# Patient Record
Sex: Female | Born: 1975 | Hispanic: No | Marital: Married | Smoking: Never smoker
Health system: Southern US, Community
[De-identification: ages and names within clinical notes are randomized; demographics above are authoritative.]

## PROBLEM LIST (undated history)

## (undated) DIAGNOSIS — N390 Urinary tract infection, site not specified: Secondary | ICD-10-CM

## (undated) DIAGNOSIS — K8020 Calculus of gallbladder without cholecystitis without obstruction: Secondary | ICD-10-CM

## (undated) HISTORY — PX: DILATION AND CURETTAGE OF UTERUS: SHX78

## (undated) HISTORY — PX: TUBAL LIGATION: SHX77

## (undated) HISTORY — PX: CHOLECYSTECTOMY: SHX55

---

## 2004-12-20 ENCOUNTER — Emergency Department (HOSPITAL_COMMUNITY): Admission: EM | Admit: 2004-12-20 | Discharge: 2004-12-20 | Payer: Self-pay | Admitting: Emergency Medicine

## 2004-12-29 ENCOUNTER — Observation Stay (HOSPITAL_COMMUNITY): Admission: RE | Admit: 2004-12-29 | Discharge: 2004-12-30 | Payer: Self-pay | Admitting: Gastroenterology

## 2008-12-02 ENCOUNTER — Emergency Department (HOSPITAL_COMMUNITY): Admission: EM | Admit: 2008-12-02 | Discharge: 2008-12-03 | Payer: Self-pay | Admitting: Emergency Medicine

## 2008-12-23 ENCOUNTER — Emergency Department (HOSPITAL_COMMUNITY): Admission: EM | Admit: 2008-12-23 | Discharge: 2008-12-23 | Payer: Self-pay | Admitting: Emergency Medicine

## 2010-11-01 LAB — URINALYSIS, ROUTINE W REFLEX MICROSCOPIC
Glucose, UA: NEGATIVE mg/dL
Protein, ur: NEGATIVE mg/dL
Specific Gravity, Urine: 1.004 — ABNORMAL LOW (ref 1.005–1.030)

## 2010-11-01 LAB — URINE CULTURE

## 2010-11-01 LAB — URINE MICROSCOPIC-ADD ON

## 2010-11-01 LAB — POCT I-STAT, CHEM 8
HCT: 39 % (ref 36.0–46.0)
Hemoglobin: 13.3 g/dL (ref 12.0–15.0)
Potassium: 3.6 mEq/L (ref 3.5–5.1)
Sodium: 139 mEq/L (ref 135–145)

## 2010-11-01 LAB — POCT PREGNANCY, URINE: Preg Test, Ur: NEGATIVE

## 2010-12-09 NOTE — H&P (Signed)
[name], [name]                  ACCOUNT NO.:  [id number]   MEDICAL RECORD NO.:  [id number]          PATIENT TYPE:  OBV   LOCATION:  5151                         FACILITY:  [facility]   PHYSICIAN:  Jordan Hawks. Elnoria Howard, MD    DATE OF BIRTH:  Aug 16, 1975   DATE OF ADMISSION:  12/29/2004  DATE OF DISCHARGE:                                HISTORY & PHYSICAL   REASON FOR ADMISSION:  Abdominal pain post ERCP.   HISTORY OF PRESENT ILLNESS:  This is a 35 year old female with a past  medical history of cholelithiasis, status post ERCP for abnormal liver  enzymes and intra as well as extrahepatic biliary ductal dilation.  The ERCP  was performed without complication.  Pancreatic duct was not cannulated  during the procedure.  The ERCP was not definitive for ductal stones.  However, CPD was dilated at 1.5 centimeters in the greatest dimension.  A 1  centimeter sphincterotomy was made without complications.  Postprocedurely,  the patient complained about having epigastric pain and nausea and vomiting.  She also states that there is some radiation of the pain to the back.  Vital  signs remained stable in the recovery area.   PAST MEDICAL HISTORY:  As stated above.   PAST SURGICAL HISTORY:  As stated above.   FAMILY HISTORY:  Noncontributory.   SOCIAL HISTORY:  The patient is married with three children.   MEDICATIONS:  None.   ALLERGIES:  No known drug allergies.   PHYSICAL EXAMINATION:  VITAL SIGNS:  Blood pressure 105/58, heart rate 76,  pulse oximetry 98, respirations 20, temperature 97.5.  GENERAL:  The patient is uncomfortable appearing.  HEENT:  Normocephalic and atraumatic.  Extraocular muscles are intact.  Pupils are equal, round and reactive to light.  NECK:  Supple.  No lymphadenopathy.  LUNGS:  Clear to auscultation bilaterally.  CARDIOVASCULAR:  Regular rate and rhythm.  ABDOMEN:  Flat, soft, tympanic, positive bowel sounds.  There is epigastric  tenderness.  No rebound.  EXTREMITIES:  No clubbing, cyanosis, or edema.   LABORATORY DATA:  Pending at this time.   IMPRESSION:  Abdominal pain questionable secondary to post ERCP  pancreatitis, perforation or gaseous distention.  It will be prudent to  admit the patient for further observation.   PLAN:  1.  Admit for 23 hours observation or longer if necessary.  2.  NPO.  3.  To check a KUB at this time to rule out perforation.  4.  To check CBC, CMET, amylase and lipase.       PDH/MEDQ  D:  12/30/2004  T:  12/30/2004  Job:  [id number]

## 2010-12-09 NOTE — Discharge Summary (Signed)
[name], [name]                  ACCOUNT NO.:  [id number]   MEDICAL RECORD NO.:  [id number]          PATIENT TYPE:  OBV   LOCATION:  5151                         FACILITY:  [facility]   PHYSICIAN:  Jordan Hawks. Elnoria Howard, MD    DATE OF BIRTH:  [birth year]   DATE OF ADMISSION:  12/29/2004  DATE OF DISCHARGE:  12/30/2004                                 DISCHARGE SUMMARY   ADMISSION DIAGNOSIS:  Abdominal pain status post ERCP.   DISCHARGE DIAGNOSIS:  1.  Mild post ERCP pancreatitis.  2.  Dilated bile ducts.  3.  Status post cholecystectomy.   HISTORY OF PRESENT ILLNESS:  Briefly, the patient is a 35 year old female  with a past medical history of cholelithiasis who was noted to have dilated  ducts and abnormal liver enzymes on acute evaluation in the emergency room  a couple of weeks prior to admission.  The patient stated that she had  epigastric pain which resulted in workup.  The pain had resolved and she was  subsequently referred to Dr. Elnoria Howard as an outpatient for further evaluation.  Upon review of the patient's ultrasound and laboratory values, it was felt  that the patient may have a retained stone in the common bile duct.  Therefore, she was scheduled for an ERCP.  This was performed on December 29, 2004, and the procedure was performed without any complications.  However,  post procedure, the patient did complain about having abdominal pain in the  epigastric region with some radiation to the back.  Subsequently, the  patient was admitted for further evaluation and treatment.   PAST MEDICAL HISTORY:  As stated in the history of present illness.   PAST SURGICAL HISTORY:  As stated in the history of present illness.   FAMILY HISTORY:  Noncontributory.   SOCIAL HISTORY:  The patient is married with three children.   ALLERGIES:  No known drug allergies.   LABORATORY DATA:  On admission, AST 103, ALT 102, total bilirubin 1.3,  alkaline phos 104, amylase 394, lipase 39, albumin 3.8.   Sodium 136,  potassium 3.6, chloride 105, CO2 23, BUN 5, creatinine 0.7, glucose 99.  White blood cell count 10.9, hemoglobin 12.5, platelets 289.   HOSPITAL COURSE:  The patient was admitted and a KUB was performed which was  negative for any evidence of perforation.  She was also started on pain  medications with morphine 2 to 4 mg IV.  Over the course of the evening, she  did require it, however, at 3 a.m. on December 30, 2004, she did not request any  further pain medication.  On morning rounds, the patient was evaluated and  her pain had markedly improved at that time.  It was felt that she would be  able to be advanced on a liquid diet which she tolerated without difficulty.  Upon re-examination immediately prior to discharge, the patient states that  the pain has almost resolved and she was able to tolerate p.o. without  problems.  Review of the laboratory values revealed there is a marked  decline in her amylase  from 394 to 203, transaminases have also decreased.  It was felt that the patient can be discharged safely without any further  problems.  Overall, it was felt that the patient had a mild post ERCP  pancreatitis.   DISCHARGE INSTRUCTIONS:  The patient is scheduled to follow up with Dr. Elnoria Howard  in two weeks.  Percocet, 15 tablets, 5/325 mg p.o., will be provided for the  patient for precautionary basis if the pain should increase.  She is also  instructed to advance her diet slowly when she is at home.  If any further  problems arise, she is to return to the emergency room  or to call Dr. Elnoria Howard  for any difficulties.       PDH/MEDQ  D:  12/30/2004  T:  12/30/2004  Job:  [id number]

## 2011-07-22 ENCOUNTER — Emergency Department (HOSPITAL_COMMUNITY)
Admission: EM | Admit: 2011-07-22 | Discharge: 2011-07-22 | Disposition: A | Discharge status: Home / Self Care | Payer: Self-pay | Attending: Emergency Medicine | Admitting: Emergency Medicine

## 2011-07-22 ENCOUNTER — Encounter: Payer: Self-pay | Admitting: Emergency Medicine

## 2011-07-22 DIAGNOSIS — N390 Urinary tract infection, site not specified: Secondary | ICD-10-CM | POA: Insufficient documentation

## 2011-07-22 DIAGNOSIS — R3915 Urgency of urination: Secondary | ICD-10-CM | POA: Insufficient documentation

## 2011-07-22 DIAGNOSIS — R300 Dysuria: Secondary | ICD-10-CM | POA: Insufficient documentation

## 2011-07-22 DIAGNOSIS — R350 Frequency of micturition: Secondary | ICD-10-CM | POA: Insufficient documentation

## 2011-07-22 HISTORY — DX: Calculus of gallbladder without cholecystitis without obstruction: K80.20

## 2011-07-22 HISTORY — DX: Urinary tract infection, site not specified: N39.0

## 2011-07-22 LAB — URINE MICROSCOPIC-ADD ON

## 2011-07-22 LAB — URINALYSIS, ROUTINE W REFLEX MICROSCOPIC
Nitrite: POSITIVE — AB
Specific Gravity, Urine: 1.005 (ref 1.005–1.030)
pH: 7 (ref 5.0–8.0)

## 2011-07-22 MED ORDER — CIPROFLOXACIN HCL 500 MG PO TABS
500.0000 mg | ORAL_TABLET | Freq: Once | ORAL | Status: AC
  Administered 2011-07-22: 500 mg via ORAL
  Filled 2011-07-22: qty 1

## 2011-07-22 MED ORDER — CIPROFLOXACIN HCL 500 MG PO TABS: 500.0000 mg | ORAL_TABLET | Freq: Two times a day (BID) | ORAL | Status: AC

## 2011-07-22 NOTE — ED Provider Notes (Signed)
History     CSN: [id number]  Arrival date & time 07/22/11  0115   First MD Initiated Contact with Patient 07/22/11 0431      Chief Complaint  Patient presents with  . Dysuria    (Consider location/radiation/quality/duration/timing/severity/associated sxs/prior treatment) Patient is a 35 y.o. female presenting with dysuria. The history is provided by the patient and a relative. No language interpreter was used.  Dysuria  This is a recurrent problem. The current episode started 3 to 5 hours ago. The problem occurs every urination. The problem has not changed since onset.The quality of the pain is described as burning. The pain is at a severity of 8/10. The pain is severe. There has been no fever. She is sexually active. There is no history of pyelonephritis. Associated symptoms include frequency and urgency. Pertinent negatives include no chills, no sweats, no vomiting, no discharge, no possible pregnancy and no flank pain. She has tried nothing for the symptoms. Her past medical history is significant for recurrent UTIs. Her past medical history does not include kidney stones, single kidney or urological procedure.    Past Medical History  Diagnosis Date  . UTI (lower urinary tract infection)   . Gallstones     Past Surgical History  Procedure Date  . Cholecystectomy     No family history on file.  History  Substance Use Topics  . Smoking status: Never Smoker   . Smokeless tobacco: Not on file  . Alcohol Use: No    OB History    Grav Para Term Preterm Abortions TAB SAB Ect Mult Living                  Review of Systems  Constitutional: Negative for chills.  HENT: Negative for facial swelling.   Eyes: Negative for discharge.  Respiratory: Negative for apnea.   Cardiovascular: Negative for chest pain.  Gastrointestinal: Negative for vomiting and abdominal distention.  Genitourinary: Positive for dysuria, urgency and frequency. Negative for flank pain.    Musculoskeletal: Negative for arthralgias.  Skin: Negative.   Neurological: Negative.   Hematological: Negative.   Psychiatric/Behavioral: Negative.     Allergies  Review of patient's allergies indicates no known allergies.  Home Medications   Current Outpatient Rx  Name Route Sig Dispense Refill  . PHENAZOPYRIDINE HCL 100 MG PO TABS Oral Take 200 mg by mouth once. Urinary pain     . CIPROFLOXACIN HCL 500 MG PO TABS Oral Take 1 tablet (500 mg total) by mouth 2 (two) times daily. 14 tablet 0    BP 98/70  Pulse 72  Temp(Src) 97.9 F (36.6 C) (Oral)  Resp 14  SpO2 100%  LMP 06/22/2011  Physical Exam  Constitutional: She is oriented to person, place, and time. She appears well-developed and well-nourished.  HENT:  Head: Normocephalic and atraumatic.  Eyes: Conjunctivae are normal. Pupils are equal, round, and reactive to light.  Neck: Normal range of motion. Neck supple.  Cardiovascular: Normal rate and regular rhythm.   Pulmonary/Chest: Effort normal and breath sounds normal.  Abdominal: Soft. Bowel sounds are normal.  Musculoskeletal: Normal range of motion.  Neurological: She is alert and oriented to person, place, and time.  Psychiatric: She has a normal mood and affect.    ED Course  Procedures (including critical care time)  Labs Reviewed  URINALYSIS, ROUTINE W REFLEX MICROSCOPIC - Abnormal; Notable for the following:    Color, Urine ORANGE (*) BIOCHEMICALS MAY BE AFFECTED BY COLOR   APPearance CLOUDY (*)  Hgb urine dipstick LARGE (*)    Nitrite POSITIVE (*)    Leukocytes, UA LARGE (*)    All other components within normal limits  URINE MICROSCOPIC-ADD ON - Abnormal; Notable for the following:    Bacteria, UA FEW (*)    All other components within normal limits  POCT PREGNANCY, URINE  POCT PREGNANCY, URINE  POCT PREGNANCY, URINE   No results found.   1. Urinary tract infection       MDM  Return for worsening symptoms.  Follow up for  recheck        Serria Sloma K Nyeemah Jennette-Rasch, MD 07/22/11 [phone]

## 2011-07-22 NOTE — ED Notes (Signed)
Taught ways to prevent uti's

## 2011-07-22 NOTE — ED Notes (Signed)
PT. REPORTS DYSURIA ONSET THIS EVENING , DENIES HEMATURIA ,  HISTORY OF UTI ,  DENIES NAUSEA OR VOMITTING ,  NO FEVER OR CHILLS.

## 2011-07-24 LAB — POCT PREGNANCY, URINE: Preg Test, Ur: NEGATIVE

## 2015-02-05 ENCOUNTER — Encounter (HOSPITAL_COMMUNITY): Payer: Self-pay | Admitting: Emergency Medicine

## 2015-02-05 ENCOUNTER — Emergency Department (HOSPITAL_COMMUNITY)
Admission: EM | Admit: 2015-02-05 | Discharge: 2015-02-06 | Disposition: A | Discharge status: Home / Self Care | Payer: Self-pay | Attending: Emergency Medicine | Admitting: Emergency Medicine

## 2015-02-05 DIAGNOSIS — Z8719 Personal history of other diseases of the digestive system: Secondary | ICD-10-CM | POA: Insufficient documentation

## 2015-02-05 DIAGNOSIS — R112 Nausea with vomiting, unspecified: Secondary | ICD-10-CM | POA: Insufficient documentation

## 2015-02-05 DIAGNOSIS — R197 Diarrhea, unspecified: Secondary | ICD-10-CM | POA: Insufficient documentation

## 2015-02-05 DIAGNOSIS — R1084 Generalized abdominal pain: Secondary | ICD-10-CM | POA: Insufficient documentation

## 2015-02-05 DIAGNOSIS — Z87440 Personal history of urinary (tract) infections: Secondary | ICD-10-CM | POA: Insufficient documentation

## 2015-02-05 DIAGNOSIS — R109 Unspecified abdominal pain: Secondary | ICD-10-CM

## 2015-02-05 DIAGNOSIS — Z3202 Encounter for pregnancy test, result negative: Secondary | ICD-10-CM | POA: Insufficient documentation

## 2015-02-05 LAB — URINALYSIS, ROUTINE W REFLEX MICROSCOPIC
Bilirubin Urine: NEGATIVE
Glucose, UA: NEGATIVE mg/dL
HGB URINE DIPSTICK: NEGATIVE
Ketones, ur: >80 mg/dL — AB
Leukocytes, UA: NEGATIVE
Nitrite: NEGATIVE
PH: 7.5 (ref 5.0–8.0)
Protein, ur: NEGATIVE mg/dL
SPECIFIC GRAVITY, URINE: 1.019 (ref 1.005–1.030)
Urobilinogen, UA: 0.2 mg/dL (ref 0.0–1.0)

## 2015-02-05 LAB — I-STAT BETA HCG BLOOD, ED (MC, WL, AP ONLY)

## 2015-02-05 LAB — COMPREHENSIVE METABOLIC PANEL
ALT: 20 U/L (ref 14–54)
AST: 21 U/L (ref 15–41)
Albumin: 4 g/dL (ref 3.5–5.0)
Alkaline Phosphatase: 72 U/L (ref 38–126)
Anion gap: 12 (ref 5–15)
BUN: 12 mg/dL (ref 6–20)
CALCIUM: 8.9 mg/dL (ref 8.9–10.3)
CHLORIDE: 102 mmol/L (ref 101–111)
CO2: 23 mmol/L (ref 22–32)
CREATININE: 0.56 mg/dL (ref 0.44–1.00)
GFR calc non Af Amer: >60 mL/min (ref >60)
Glucose, Bld: 112 mg/dL — ABNORMAL HIGH (ref 65–99)
Potassium: 3.4 mmol/L — ABNORMAL LOW (ref 3.5–5.1)
Sodium: 137 mmol/L (ref 135–145)
Total Bilirubin: 1 mg/dL (ref 0.3–1.2)
Total Protein: 7.4 g/dL (ref 6.5–8.1)

## 2015-02-05 LAB — CBC
HCT: 36.2 % (ref 36.0–46.0)
HEMOGLOBIN: 12 g/dL (ref 12.0–15.0)
MCH: 28.6 pg (ref 26.0–34.0)
MCHC: 33.1 g/dL (ref 30.0–36.0)
MCV: 86.4 fL (ref 78.0–100.0)
PLATELETS: 276 10*3/uL (ref 150–400)
RBC: 4.19 MIL/uL (ref 3.87–5.11)
RDW: 12.3 % (ref 11.5–15.5)
WBC: 12.7 10*3/uL — ABNORMAL HIGH (ref 4.0–10.5)

## 2015-02-05 LAB — LIPASE, BLOOD: LIPASE: 24 U/L (ref 22–51)

## 2015-02-05 MED ORDER — MORPHINE SULFATE 4 MG/ML IJ SOLN
4.0000 mg | Freq: Once | INTRAMUSCULAR | Status: AC
  Administered 2015-02-05: 4 mg via INTRAVENOUS
  Filled 2015-02-05: qty 1

## 2015-02-05 MED ORDER — SODIUM CHLORIDE 0.9 % IV BOLUS (SEPSIS)
1000.0000 mL | Freq: Once | INTRAVENOUS | Status: AC
Start: 2015-02-05 — End: 2015-02-06
  Administered 2015-02-05: 1000 mL via INTRAVENOUS

## 2015-02-05 MED ORDER — SODIUM CHLORIDE 0.9 % IV BOLUS (SEPSIS)
1000.0000 mL | Freq: Once | INTRAVENOUS | Status: AC
Start: 2015-02-05 — End: 2015-02-06
  Administered 2015-02-06: 1000 mL via INTRAVENOUS

## 2015-02-05 MED ORDER — ONDANSETRON 4 MG PO TBDP
8.0000 mg | ORAL_TABLET | Freq: Once | ORAL | Status: AC
  Administered 2015-02-05: 8 mg via ORAL
  Filled 2015-02-05: qty 2

## 2015-02-05 MED ORDER — IOHEXOL 300 MG/ML  SOLN
25.0000 mL | Freq: Once | INTRAMUSCULAR | Status: AC | PRN
  Administered 2015-02-05: 25 mL via ORAL

## 2015-02-05 NOTE — ED Notes (Signed)
C/o mid upper abd pain x 3 days with nausea, vomiting, and diarrhea.

## 2015-02-05 NOTE — ED Provider Notes (Signed)
CSN: [id number]     Arrival date & time 02/05/15  2153 History   First MD Initiated Contact with Patient 02/05/15 2221     Chief Complaint  Patient presents with  . Abdominal Pain     (Consider location/radiation/quality/duration/timing/severity/associated sxs/prior Treatment) Patient is a 39 y.o. female presenting with abdominal pain. The history is provided by the patient and medical records. No language interpreter was used.  Abdominal Pain Associated symptoms: diarrhea, nausea and vomiting   Associated symptoms: no chest pain, no constipation, no cough, no dysuria, no fatigue, no fever, no hematuria and no shortness of breath      Ave P Bochicchio is a 39 y.o. female  with a hx of recurrent UTI, cholecystectomy 2/2 gallstones presents to the Emergency Department complaining of gradual, persistent, progressively worsening generalized abd pain onset 2 days ago and acutely worsening today.  Pt reports 10/10 sharp stabbing pain in the periumbilical region. Associated symptoms include nausea, vomiting and several episodes of loose stool.  She denies hematemesis, melanoma or hematochezia.  Patient without abdominal surgery aside from her cholecystectomy. No history of bowel obstruction.  Patient last ate at 6 PM but had emesis afterwards.  He denies sick contacts. Nothing makes it better or worse. Patient denies fever, chills, headache, neck pain, chest pain, shortness of breath, weakness, dizziness, syncope, dysuria, hematuria. Patient reports symptoms do not feel like her typical UTI. Last menstrual cycle 01/26/2015.   Past Medical History  Diagnosis Date  . UTI (lower urinary tract infection)   . Gallstones    Past Surgical History  Procedure Laterality Date  . Cholecystectomy     No family history on file. History  Substance Use Topics  . Smoking status: Never Smoker   . Smokeless tobacco: Not on file  . Alcohol Use: No   OB History    No data available     Review of Systems    Constitutional: Negative for fever, diaphoresis, appetite change, fatigue and unexpected weight change.  HENT: Negative for mouth sores.   Eyes: Negative for visual disturbance.  Respiratory: Negative for cough, chest tightness, shortness of breath and wheezing.   Cardiovascular: Negative for chest pain.  Gastrointestinal: Positive for nausea, vomiting, abdominal pain and diarrhea. Negative for constipation.  Endocrine: Negative for polydipsia, polyphagia and polyuria.  Genitourinary: Negative for dysuria, urgency, frequency and hematuria.  Musculoskeletal: Negative for back pain and neck stiffness.  Skin: Negative for rash.  Allergic/Immunologic: Negative for immunocompromised state.  Neurological: Negative for syncope, light-headedness and headaches.  Hematological: Does not bruise/bleed easily.  Psychiatric/Behavioral: Negative for sleep disturbance. The patient is not nervous/anxious.       Allergies  Review of patient's allergies indicates no known allergies.  Home Medications   Prior to Admission medications   Not on File   BP 94/57 mmHg  Pulse 73  Temp(Src) 97.6 F (36.4 C) (Oral)  Resp 18  SpO2 100%  LMP 01/26/2015 Physical Exam  Constitutional: She appears well-developed and well-nourished. No distress.  Awake, alert, nontoxic appearance  HENT:  Head: Normocephalic and atraumatic.  Mouth/Throat: Oropharynx is clear and moist. No oropharyngeal exudate.  Eyes: Conjunctivae are normal. No scleral icterus.  Neck: Normal range of motion. Neck supple.  Cardiovascular: Normal rate, regular rhythm and intact distal pulses.   Pulmonary/Chest: Effort normal and breath sounds normal. No respiratory distress. She has no wheezes.  Equal chest expansion  Abdominal: Soft. Bowel sounds are normal. She exhibits no distension and no mass. There is tenderness. There  is guarding. There is no rebound and no CVA tenderness.    Well-healed midline incision Generalized tenderness  to palpation with guarding throughout No CVA tenderness  Musculoskeletal: Normal range of motion. She exhibits no edema.  Neurological: She is alert.  Speech is clear and goal oriented Moves extremities without ataxia  Skin: Skin is warm and dry. She is not diaphoretic.  Psychiatric: She has a normal mood and affect.  Nursing note and vitals reviewed.   ED Course  Procedures (including critical care time) Labs Review Labs Reviewed  COMPREHENSIVE METABOLIC PANEL - Abnormal; Notable for the following:    Potassium 3.4 (*)    Glucose, Bld 112 (*)    All other components within normal limits  CBC - Abnormal; Notable for the following:    WBC 12.7 (*)    All other components within normal limits  URINALYSIS, ROUTINE W REFLEX MICROSCOPIC (NOT [facility]) - Abnormal; Notable for the following:    APPearance CLOUDY (*)    Ketones, ur >80 (*)    All other components within normal limits  LIPASE, BLOOD  I-STAT BETA HCG BLOOD, ED ([facility], [facility], [facility] ONLY)    Imaging Review No results found.   EKG Interpretation None      MDM   Final diagnoses:  Abdominal pain, acute  Abdominal pain, acute   Shonika P Ruppert presents with severe abdominal pain, guarding on exam.  Pt reports N/V/D.   Labs with leukocytosis and mild hypokalemia, but no other abnormalities.  Pt abd without rebound, but guarding on exam.  Will CT, give pain control and fluids.  Pt with >80 keytones in her urine.  Fluids given.  1:30 AM CT pending.  Pt discussed with Dr. Mora Bellmanni who will follow.  Anticipate d/c home if CT negative and pt is able to tolerate PO.    BP 94/57 mmHg  Pulse 73  Temp(Src) 97.6 F (36.4 C) (Oral)  Resp 18  SpO2 100%  LMP 01/26/2015   Dahlia ClientHannah Aliece Honold, PA-C 02/06/15 0130  Mirian MoMatthew Gentry, MD 02/08/15 2348

## 2015-02-05 NOTE — ED Notes (Signed)
The pt is c.o generalized abd pain today with nv and diarrhea  lmp July 5th.  She ate once 1800  Some soap and vomited it back

## 2015-02-06 ENCOUNTER — Emergency Department (HOSPITAL_COMMUNITY): Payer: Self-pay

## 2015-02-06 ENCOUNTER — Encounter (HOSPITAL_COMMUNITY): Payer: Self-pay

## 2015-02-06 MED ORDER — RANITIDINE HCL 150 MG PO CAPS: 300.0000 mg | ORAL_CAPSULE | Freq: Every day | ORAL | Status: DC

## 2015-02-06 MED ORDER — RANITIDINE HCL 150 MG/10ML PO SYRP
300.0000 mg | ORAL_SOLUTION | Freq: Once | ORAL | Status: AC
  Administered 2015-02-06: 300 mg via ORAL
  Filled 2015-02-06: qty 20

## 2015-02-06 MED ORDER — IOHEXOL 300 MG/ML  SOLN
100.0000 mL | Freq: Once | INTRAMUSCULAR | Status: AC | PRN
  Administered 2015-02-06: 80 mL via INTRAVENOUS

## 2015-02-06 NOTE — ED Notes (Signed)
Pt has iv started given pain med

## 2015-02-06 NOTE — ED Notes (Signed)
To c-t.  Iv fluids infused

## 2015-02-06 NOTE — ED Provider Notes (Addendum)
Patient was signed out to me as pending CT scan for evaluation of abdominal pain. CT scan reveals possible abdominal ulcer which was nonperforated. Will give the patient ranitidine prior to discharge and write a prescription.GI follow-up was advised within 3 days. Currently she is asymptomatic and has no pain.   Tomasita CrumbleAdeleke Tyri Elmore, MD 02/06/15 [phone]

## 2015-02-06 NOTE — ED Notes (Signed)
The pt has drank both bottles of the oral contrast c-t will get at 0100

## 2015-02-06 NOTE — ED Notes (Signed)
Pt returned from   c-t   No pain 

## 2015-02-06 NOTE — Discharge Instructions (Signed)
Peptic Ulcer [name], takes Zantac daily until you see gastroenterology for follow-up. Make an appointment within the next 3 days. If any symptoms worsen come back to the emergency department immediately. Thank you. A peptic ulcer is a painful sore in the lining of your esophagus, stomach, or in the first part of your small intestine. The main causes of an ulcer can be:  An infection.  Using certain pain medicines too often or too much.  Smoking. HOME CARE  Avoid smoking, alcohol, and caffeine.  Avoid foods that bother you.  Only take medicine as told by your doctor. Do not take any medicines your doctor has not approved.  Keep all doctor visits as told. GET HELP IF:  You do not get better in 7 days after starting treatment.  You keep having an upset stomach (indigestion) or heartburn. GET HELP RIGHT AWAY IF:  You have sudden, sharp, or lasting belly (abdominal) pain.  You have bloody, black, or tarry poop (stool).  You throw up (vomit) blood or your throw up looks like coffee grounds.  You get light-headed, weak, or feel like you will pass out (faint).  You get sweaty or feel sticky and cold to the touch (clammy). MAKE SURE YOU:   Understand these instructions.  Will watch your condition.  Will get help right away if you are not doing well or get worse. Document Released: 10/04/2009 Document Revised: 11/24/2013 Document Reviewed: 02/07/2012 [facility] Patient Information 2015 [place]. This information is not intended to replace advice given to you by your health care provider. Make sure you discuss any questions you have with your health care provider.

## 2016-09-04 ENCOUNTER — Encounter: Payer: Self-pay | Admitting: Obstetrics & Gynecology

## 2016-09-04 ENCOUNTER — Ambulatory Visit (INDEPENDENT_AMBULATORY_CARE_PROVIDER_SITE_OTHER): Payer: Self-pay | Admitting: Obstetrics & Gynecology

## 2016-09-04 VITALS — BP 95/58 | HR 74 | Wt 115.8 lb

## 2016-09-04 DIAGNOSIS — N939 Abnormal uterine and vaginal bleeding, unspecified: Secondary | ICD-10-CM

## 2016-09-04 DIAGNOSIS — R300 Dysuria: Secondary | ICD-10-CM

## 2016-09-04 DIAGNOSIS — Z113 Encounter for screening for infections with a predominantly sexual mode of transmission: Secondary | ICD-10-CM

## 2016-09-04 DIAGNOSIS — R301 Vesical tenesmus: Secondary | ICD-10-CM

## 2016-09-04 LAB — POCT URINALYSIS DIP (DEVICE)
Bilirubin Urine: NEGATIVE
Glucose, UA: NEGATIVE mg/dL
KETONES UR: NEGATIVE mg/dL
Leukocytes, UA: NEGATIVE
Nitrite: NEGATIVE
PH: 6 (ref 5.0–8.0)
PROTEIN: NEGATIVE mg/dL
Specific Gravity, Urine: 1.01 (ref 1.005–1.030)
Urobilinogen, UA: 0.2 mg/dL (ref 0.0–1.0)

## 2016-09-04 MED ORDER — AMITRIPTYLINE HCL 10 MG PO TABS: 50.0000 mg | ORAL_TABLET | Freq: Every day | ORAL | Status: DC

## 2016-09-04 NOTE — Progress Notes (Signed)
Pt declines interpreter, signed release for her daughter to interpret for her.

## 2016-09-04 NOTE — Progress Notes (Signed)
History:  41 y.o. [id number] here today for eval of pelvic pain. Pt c/o dysuria. She report sx for 1 year.  She has been treated for UTI with no relief of sx.  Pt reports pain 3 days per week.  Pt reports relief with Tylenol or NSAIDs. No exacerbating factors.  In Nov and Dec her period lasted 12 days. In Jan her menses lasted 7 days and then 1 week later the same sx but no menses.   Pt reports pain with intercourse. When having intercourse the pain is with insertion.  Pt reports discharge that was excessive.  She is not sure if it had an odor. She thinks that it smells like 'raw chicken.' Pt reports that the discharge feels 'oily' The pain is worse with menses.  Since Nov the pain is worse with menses in a great degree.  She is s/p Sterilization.   The following portions of the patient's history were reviewed and updated as appropriate: allergies, current medications, past family history, past medical history, past social history, past surgical history and problem list.  Review of Systems:  Pertinent items are noted in HPI.   Objective:  Physical Exam Blood pressure (!) 95/58, pulse 74, weight 115 lb 12.8 oz (52.5 kg). BP (!) 95/58   Pulse 74   Wt 115 lb 12.8 oz (52.5 kg)  CONSTITUTIONAL: Well-developed, well-nourished female in no acute distress.  HENT:  Normocephalic, atraumatic EYES: Conjunctivae and EOM are normal. No scleral icterus.  NECK: Normal range of motion SKIN: Skin is warm and dry. No rash noted. Not diaphoretic.No pallor. NEUROLGIC: Alert and oriented to person, place, and time. Normal coordination.  Abd: Soft, nontender and nondistended Pelvic: Normal appearing external genitalia; normal appearing vaginal mucosa and cervix.   Discharge- thin grey.  Small uterus, no other palpable masses. There is tenderness to palpation over the bladder. No apparent uterine or adnexal tenderness  Labs and Imaging 07/06/2016 IMPRESSION:  Bilateral ovarian cysts   Result Narrative   COMPARISON:None. TECHNIQUE:Transvaginal and transabdominal INDICATION: N93.8: Other specified abnormal uterine and vaginal bleeding.   FINDINGS:  UTERUS: - Uterus is 8.6 cm. - No focal uterine abnormality is seen. - Endometrium measures 10 mm.  CUL-DE-SAC: - Trace fluid pelvic cul-de-sac.  OVARIES: - Right ovary measures 3.9 cm. Simple right ovarian cyst present measuring 2.5 cm and 2 cm. - Left ovary measures 3.1 cm. - No abnormal solid ovarian masses. There is a 2.0 cm left ovarian cyst. -  ADDITIONAL/INCIDENTAL:     Assessment & Plan:  Painful bladder.  Possible bladder spasms. Cannot r/o interstitial cystitis   Amitriptyline  25mg  po nightly for 1 week then increase to 50mg  nightly. Pt to decrease or eliminate all caffeine  Pt to f/u in 4 weeks or sooner prn   Total face-to-face time with patient was 30 min.  Greater than 50% was spent in counseling and coordination of care with the patient. Spanish interpreter used.   Daman Steffenhagen L. Harraway-Smith, M.D., [facility]

## 2016-09-04 NOTE — Patient Instructions (Signed)
Cistitis intersticial (Interstitial Cystitis) La cistitis intersticial es una afeccin que causa inflamacin de la vejiga. La vejiga es un rgano hueco que se encuentra en la parte inferior del abdomen. Almacena la orina que producen los riones. La cistitis intersticial puede causar dolor en la zona de la vejiga. Tambin es posible que tenga la necesidad frecuente y urgente de orinar. La gravedad de la cistitis intersticial puede variar de una persona a la otra. Puede presentar exacerbaciones de la afeccin, que luego quizs desaparezcan durante un tiempo. Para muchas personas, esta afeccin se convierte en un problema a largo plazo. CAUSAS Se desconoce la causa de esta afeccin. FACTORES DE RIESGO Es ms probable que esta afeccin se manifieste en las mujeres. SNTOMAS Los sntomas de la cistitis intersticial varan y pueden modificarse con el tiempo. Entre los sntomas se pueden incluir los siguientes:  Molestias o dolor en la zona de la vejiga. Pueden variar de leves a graves. La intensidad del dolor puede variar en funcin de si la vejiga est llena con orina o vaca.  Dolor en la pelvis.  Necesidad urgente de orinar.  Ganas frecuentes de orinar.  Dolor durante las relaciones sexuales.  Sangrado en forma de puntitos en la pared de la vejiga. En las mujeres, los sntomas a menudo empeoran durante la menstruacin. DIAGNSTICO Esta afeccin se diagnostica evaluando los sntomas y descartando otras causas. Se le practicar un examen fsico. Pueden hacerle diversos estudios para descartar otras afecciones. Entre los ms frecuentes, se incluyen los siguientes:  Anlisis de orina.  Una cistoscopa. En este estudio, se utiliza un instrumento similar a un telescopio muy pequeo para observar la vejiga.  Biopsia. La biopsia consiste en extraer una muestra de tejido de la pared de la vejiga para examinar con un microscopio. TRATAMIENTO La cistitis intersticial no tiene cura, pero hay  tratamientos para controlar los sntomas. Trabaje en estrecha colaboracin con el mdico a fin de hallar los tratamientos que sean ms eficaces para su caso. Las opciones de tratamiento son las siguientes:  Medicamentos que alivien el dolor y ayuden a reducir la cantidad de veces que sienta la necesidad de orinar.  Entrenamiento de la vejiga. Esto consiste en aprender maneras de controlar las micciones, por ejemplo:  Orinar en horarios programados.  Entrenarse para retrasar la miccin.  Hacer ejercicios (ejercicios de Kegel) para fortalecer los msculos que controlan el flujo de orina.  Cambios en el estilo de vida, por ejemplo, modificar la dieta o tomar medidas para controlar el estrs.  Uso de un dispositivo que emite estimulacin elctrica para reducir el dolor.  Un procedimiento que extiende la vejiga al llenarla de aire o lquido.  Ciruga. Esto es raro. Se realiza solamente en casos extremos si los otros tratamientos no son eficaces. INSTRUCCIONES PARA EL CUIDADO EN EL HOGAR  Tome los medicamentos solamente como se lo haya indicado el mdico.  Practique las tcnicas de entrenamiento de la vejiga como se lo hayan indicado.  Lleve un diario de micciones para determinar qu alimentos, lquidos o actividades empeoran los sntomas.  Use el diario de micciones para programar las veces que va al bao. Si saldr de su casa, organcese de modo de poder cumplir con la programacin de micciones.  Asegrese de orinar antes de salir de su casa y antes de acostarse.  Haga los ejercicios de Kegel como se lo haya indicado el mdico.  No beba alcohol.  No consuma ningn producto que contenga tabaco, lo que incluye cigarrillos, tabaco de mascar o cigarrillos electrnicos.   Si necesita ayuda para dejar de fumar, consulte al mdico.  Haga cambios en la dieta como se lo haya indicado el mdico. Quizs deba evitar los alimentos picantes y los alimentos con un alto contenido de [place].  Limite  la ingesta de bebidas que estimulen la miccin. Estas bebidas incluyen refrescos, caf y t.  Concurra a todas las visitas de control como se lo haya indicado el mdico. Esto es importante. SOLICITE ATENCIN MDICA SI:  Los sntomas no mejoran despus de Medical illustratorrealizar el tratamiento.  El dolor y las molestias [place].  Tiene necesidad urgente de orinar con mayor frecuencia.  Tiene fiebre. SOLICITE ATENCIN MDICA DE INMEDIATO SI:  No puede controlar la vejiga para nada. Esta informacin no tiene Theme park managercomo fin reemplazar el consejo del mdico. Asegrese de hacerle al mdico cualquier pregunta que tenga. Document Released: 10/26/2008 Document Revised: 07/31/2014 Document Reviewed: 03/17/2014 Elsevier Interactive Patient Education  2017 [employer]

## 2016-09-05 ENCOUNTER — Other Ambulatory Visit: Payer: Self-pay | Admitting: Obstetrics & Gynecology

## 2016-09-05 DIAGNOSIS — N760 Acute vaginitis: Principal | ICD-10-CM

## 2016-09-05 DIAGNOSIS — B9689 Other specified bacterial agents as the cause of diseases classified elsewhere: Secondary | ICD-10-CM

## 2016-09-05 LAB — CERVICOVAGINAL ANCILLARY ONLY
Bacterial vaginitis: POSITIVE — AB
Candida vaginitis: NEGATIVE
Trichomonas: NEGATIVE

## 2016-09-05 MED ORDER — METRONIDAZOLE 500 MG PO TABS: 500.0000 mg | ORAL_TABLET | Freq: Two times a day (BID) | ORAL | 0 refills | Status: DC

## 2016-09-05 MED ORDER — AMITRIPTYLINE HCL 50 MG PO TABS: 50.0000 mg | ORAL_TABLET | Freq: Every day | ORAL | 1 refills | Status: AC

## 2016-09-14 NOTE — Progress Notes (Signed)
I called [place] home and work number with [employer] interpreter [phone] and was unable to leave a message on either line - for both heard a message not accepting calls at this time.

## 2016-09-18 ENCOUNTER — Encounter: Payer: Self-pay | Admitting: *Deleted

## 2016-09-18 NOTE — Progress Notes (Signed)
Letter to patient with results information.

## 2016-10-06 ENCOUNTER — Ambulatory Visit (INDEPENDENT_AMBULATORY_CARE_PROVIDER_SITE_OTHER): Payer: Self-pay | Admitting: Obstetrics & Gynecology

## 2016-10-06 ENCOUNTER — Encounter: Payer: Self-pay | Admitting: Obstetrics & Gynecology

## 2016-10-06 VITALS — BP 106/67 | HR 78 | Wt 112.9 lb

## 2016-10-06 DIAGNOSIS — N939 Abnormal uterine and vaginal bleeding, unspecified: Secondary | ICD-10-CM

## 2016-10-06 DIAGNOSIS — R3989 Other symptoms and signs involving the genitourinary system: Secondary | ICD-10-CM

## 2016-10-06 DIAGNOSIS — R301 Vesical tenesmus: Secondary | ICD-10-CM

## 2016-10-06 MED ORDER — NORETHIN ACE-ETH ESTRAD-FE 1-20 MG-MCG(24) PO TABS: 1.0000 | ORAL_TABLET | Freq: Every day | ORAL | 11 refills | Status: DC

## 2016-10-06 NOTE — Patient Instructions (Addendum)
Dismenorrea (Dysmenorrhea) Los Stage manager (dismenorrea) se originan en los espasmos musculares del tero (contracciones) durante el perodo menstrual. En algunas mujeres, el malestar es slo [place]. En otras, la dismenorrea puede ser tan intensa que interfiere con las actividades diarias durante algunos das, [employer]. La dismenorrea primaria son los clicos menstruales que duran algunos das al inicio de los perodos o poco despus. En general se inician despus de que la adolescente comienza a tener sus perodos. A medida que la mujer avanza en edad, o tiene un beb, los clicos generalmente disminuyen o desaparecen. La dismenorrea secundaria comienza en pocas posteriores de la vida, dura ms tiempo, y Conservation officer, historic buildings puede ser ms intenso. El dolor puede comenzar antes del perodo y [place] despus. CAUSAS La causa de la dismenorrea generalmente es un problema subyacente, por ejemplo:  El tejido que recubre interiormente al tero crece fuera del tero en otras reas del cuerpo (endometriosis).  The endometrial tissue, which normally lines the uterus, is found in or grows into the muscular walls of the uterus (adenomyosis).  Los vasos sanguneos de la pelvis se congestionan con sangre antes del perodo menstrual (sndrome congestivo plvico).  Crecimiento excesivo de las clulas (plipos) del revestimiento interior del tero o del cuello uterino.  Cada del tero (prolapso) debido a distensin o flojedad de los ligamentos.  Depresin.  Problemas como infeccin o inflamacin en la vejiga.  Problemas en el intestino, un tumor, o sndrome de colon irritable.  Cncer de los rganos femeninos o en la vejiga.  Un tero muy inclinado.  Una apertura muy [place] uterino cerrado.  Tumores no cancerosos en el tero (fibromas).  Enfermedad plvica inflamatoria (EPI)  Cicatrices en la pelvis (adherencias) por cirugas previas.  Quiste  ovrico  El uso del dispositivo intrauterino (DIU) como mtodo anticonceptivo. FACTORES DE RIESGO Puede tener ms riesgo de dismenorrea si:  Tiene menos de 30 aos.  La pubertad se inici temprano.  Tiene sangrado irregular o abundante.  No ha tenido hijos.  Tiene una historia familiar de este problema.  Es fumadora. SIGNOS Y SNTOMAS  Clicos y dolor punzante en la zona inferior del abdomen.  Dolores de [place].  Dolor de Doctor, hospital.  Nuseas o vmitos.  Diarrea.  Sudoracin o [employer].  Heces blandas. DIAGNSTICO El diagnstico se basa en la historia personal, los sntomas, el examen fsico, las pruebas diagnsticas o procedimientos. Las pruebas diagnsticas o procedimientos pueden ser:  Anlisis de [place].  Ecografas.  Examen de las capas que cubren el tero (dilatacin y curetaje, D&C).  Examen de la zona interna del abdomen o la pelvis con un laparoscopio.  Radiografas.  Tomografa computada.  Resonancia magntica.  Un examen del interior de la vejiga con un citoscopio.  Examen del interior del intestino o el estmago con un dispositivo especial (colonoscopio o un gastroscopio). TRATAMIENTO El tratamiento depende de la causa de la dismenorrea. El tratamiento puede incluir:  Analgsicos indicados por su mdico.  Pldoras anticonceptivas o un DIU con progesterona.  Terapia de reemplazo hormonal.  Antiinflamatorios no esteroides (AINE). Los antiinflamatorios pueden detener la produccin de prostaglandinas.  Ciruga para extirpar las adherencias, endometriosis, quiste de ovarios o fibromas.  Extirpacin del tero (histerectoma).  Inyecciones de progesterona para detener el perodo menstrual.  Corte de los nervios del sacro que van hacia los rganos femeninos (neurectoma presacra).  Corriente elctrica en los nervios sacros (estimulacin de los nervios sacros).  Medicamentos antidepresivos.  Terapia psiquitrica, psicoterapia individual o de  grupo.  Actividad fsica y fisioterapia.  Meditacin y yoga.  Acupuntura. INSTRUCCIONES PARA EL CUIDADO EN EL HOGAR  Tome slo medicamentos de venta libre o recetados, segn las indicaciones del mdico.  Coloque una almohadilla elctrica o una botella con agua caliente en la zona inferior del abdomen. No duerma con la almohadilla trmica.  Haga ejercicios aerbicos como caminar, nadar, andar en bicicleta, para Target Corporationaliviar los clicos.  Masajear la zona inferior de la espalda o el abdomen puede ser de [place]uda.  Deje de fumar.  Evite la cafena y el alcohol. SOLICITE ATENCIN MDICA SI:  El dolor no mejora con los medicamentos recetados.  Tiene dolor durante las The St. Paul Travelersrelaciones sexuales.  El dolor aumenta y no puede controlarlo con los medicamentos.  Observa una hemorragia vaginal anormal con el perodo.  Tiene nuseas o vmitos con el perodo menstrual que no puede controlar con medicamentos. SOLICITE ATENCIN MDICA DE INMEDIATO SI: Se desmaya. Esta informacin no tiene Theme park managercomo fin reemplazar el consejo del mdico. Asegrese de hacerle al mdico cualquier pregunta que tenga. Document Released: 04/19/2005 Document Revised: 03/12/2013 Document Reviewed: 12/26/2012 Elsevier Interactive Patient Education  2017 Elsevier Inc.  Lubricants  - Water or silicone-based  - No perfumes; avoid glycerin or parabens  - If water based look for information on osmolality  - Lubricate all surfaces as a part of foreplay  - Keep lubricant handy in case more is needed  - Sneaking into bathroom before sex is not a good way to use lubricants

## 2016-10-06 NOTE — Progress Notes (Signed)
History:  41 y.o. [id number] here today for reeval of irreg menses and dysuria.  Pt reports that her painful bladder is much improved.  She does report some somnolence with the meds but thinks that it is getting better.  She does report that staying away from caffeine has also helped. She did drink caffeine 2 days and her sx came back some.   She reports menses 2x/month assoc with cramping. Both her mother and her sister went through early menopause and she wondered about that. She denies hot flushes or mood changes.  She reports decreased libido but, thinks that it may be assoc with the pain.  The following portions of the patient's history were reviewed and updated as appropriate: allergies, current medications, past family history, past medical history, past social history, past surgical history and problem list.  Review of Systems:  Pertinent items are noted in HPI.   Objective:  Physical Exam Blood pressure 106/67, pulse 78, weight 112 lb 14.4 oz (51.2 kg). BP 106/67   Pulse 78   Wt 112 lb 14.4 oz (51.2 kg)  CONSTITUTIONAL: Well-developed, well-nourished female in no acute distress.  HENT:  Normocephalic, atraumatic EYES: Conjunctivae and EOM are normal. No scleral icterus.  NECK: Normal range of motion SKIN: Skin is warm and dry. No rash noted. Not diaphoretic.No pallor. NEUROLGIC: Alert and oriented to person, place, and time. Normal coordination.   Labs and Imaging No results found.  Assessment & Plan:  Painful bladder syndrome- sx improved  cont Amytryptyline 50mg  qHS  Stay away from caffeine  AUB/Dysmenorrhea-   LoEstrin 1/20 1 po q day  Pelvic US  Decreased libido   Rec control pain initially   Rec date night  Rec  [place] [employer]  Total face-to-face time with patient was 25 min.  Greater than 50% was spent in counseling and coordination of care with the patient.     Klyde Banka L. Harraway-Smith, M.D., Evern CoreFACOG

## 2016-10-13 ENCOUNTER — Ambulatory Visit (HOSPITAL_COMMUNITY)
Admission: RE | Admit: 2016-10-13 | Discharge: 2016-10-13 | Disposition: A | Discharge status: Home / Self Care | Payer: Self-pay | Source: Ambulatory Visit | Attending: Obstetrics & Gynecology | Admitting: Obstetrics & Gynecology

## 2016-10-13 DIAGNOSIS — R938 Abnormal findings on diagnostic imaging of other specified body structures: Secondary | ICD-10-CM | POA: Insufficient documentation

## 2016-10-13 DIAGNOSIS — R301 Vesical tenesmus: Secondary | ICD-10-CM

## 2016-10-13 DIAGNOSIS — N939 Abnormal uterine and vaginal bleeding, unspecified: Secondary | ICD-10-CM | POA: Insufficient documentation

## 2016-10-16 ENCOUNTER — Telehealth: Payer: Self-pay | Admitting: General Practice

## 2016-10-16 NOTE — Telephone Encounter (Signed)
Per Dr Erin FullingHarraway Smith, patient's ultrasound was normal and she should continue her OCPs. Called patient with [employer] for interpreter, her husband answered stating she wasn't in at the moment but will tell her we called.

## 2016-10-18 NOTE — Telephone Encounter (Signed)
Called pt with [employer] interpreter # [phone] and informed her of normal US results. She should continue taking the OCP's as prescribed.  Pt voiced understanding.

## 2016-10-30 ENCOUNTER — Telehealth: Payer: Self-pay

## 2016-10-31 ENCOUNTER — Inpatient Hospital Stay (HOSPITAL_COMMUNITY)
Admission: AD | Admit: 2016-10-31 | Discharge: 2016-10-31 | Disposition: A | Discharge status: Home / Self Care | Payer: Self-pay | Source: Ambulatory Visit | Attending: Obstetrics & Gynecology | Admitting: Obstetrics & Gynecology

## 2016-10-31 ENCOUNTER — Encounter (HOSPITAL_COMMUNITY): Payer: Self-pay | Admitting: *Deleted

## 2016-10-31 DIAGNOSIS — N939 Abnormal uterine and vaginal bleeding, unspecified: Secondary | ICD-10-CM | POA: Insufficient documentation

## 2016-10-31 DIAGNOSIS — Z9049 Acquired absence of other specified parts of digestive tract: Secondary | ICD-10-CM | POA: Insufficient documentation

## 2016-10-31 DIAGNOSIS — R102 Pelvic and perineal pain: Secondary | ICD-10-CM | POA: Insufficient documentation

## 2016-10-31 DIAGNOSIS — Z9889 Other specified postprocedural states: Secondary | ICD-10-CM | POA: Insufficient documentation

## 2016-10-31 DIAGNOSIS — Z87440 Personal history of urinary (tract) infections: Secondary | ICD-10-CM | POA: Insufficient documentation

## 2016-10-31 DIAGNOSIS — N898 Other specified noninflammatory disorders of vagina: Secondary | ICD-10-CM | POA: Insufficient documentation

## 2016-10-31 DIAGNOSIS — Z79899 Other long term (current) drug therapy: Secondary | ICD-10-CM | POA: Insufficient documentation

## 2016-10-31 LAB — URINALYSIS, ROUTINE W REFLEX MICROSCOPIC
Bilirubin Urine: NEGATIVE
Glucose, UA: NEGATIVE mg/dL
KETONES UR: NEGATIVE mg/dL
Leukocytes, UA: NEGATIVE
Nitrite: NEGATIVE
PROTEIN: NEGATIVE mg/dL
Specific Gravity, Urine: 1.011 (ref 1.005–1.030)
pH: 7 (ref 5.0–8.0)

## 2016-10-31 LAB — CBC
HCT: 32.1 % — ABNORMAL LOW (ref 36.0–46.0)
HEMOGLOBIN: 10.9 g/dL — AB (ref 12.0–15.0)
MCH: 29.6 pg (ref 26.0–34.0)
MCHC: 34 g/dL (ref 30.0–36.0)
MCV: 87.2 fL (ref 78.0–100.0)
PLATELETS: 380 10*3/uL (ref 150–400)
RBC: 3.68 MIL/uL — ABNORMAL LOW (ref 3.87–5.11)
RDW: 13.1 % (ref 11.5–15.5)
WBC: 9.4 10*3/uL (ref 4.0–10.5)

## 2016-10-31 LAB — POCT PREGNANCY, URINE: PREG TEST UR: NEGATIVE

## 2016-10-31 MED ORDER — IBUPROFEN 800 MG PO TABS: 800.0000 mg | ORAL_TABLET | Freq: Three times a day (TID) | ORAL | 0 refills | Status: DC | PRN

## 2016-10-31 MED ORDER — KETOROLAC TROMETHAMINE 60 MG/2ML IM SOLN
60.0000 mg | Freq: Once | INTRAMUSCULAR | Status: AC
  Administered 2016-10-31: 60 mg via INTRAMUSCULAR
  Filled 2016-10-31: qty 2

## 2016-10-31 NOTE — MAU Note (Signed)
Pt states she started having bleeding and pain on 10/20/2016. States she was placed on OCP's but "isn't working." States she changes her pad x5/day. Took Midol 2 days ago, but none since because it did not help.

## 2016-10-31 NOTE — MAU Provider Note (Signed)
History     CSN: [id number]  Arrival date and time: 10/31/16 4098   First Provider Initiated Contact with Patient 10/31/16 0211      Chief Complaint  Patient presents with  . Vaginal Bleeding   Vaginal Bleeding  The patient's primary symptoms include pelvic pain and vaginal bleeding. This is a new problem. Episode onset: March 23. The problem occurs intermittently. Progression since onset: The bleeding is the same since starting birth control, and has not improved. She reports that the pain has not improved much either.  Pain severity now: 10/10  She is not pregnant. Associated symptoms include nausea. Pertinent negatives include no chills, fever or vomiting. The vaginal discharge was bloody. The vaginal bleeding is heavier than menses. She has been passing clots. She has not been passing tissue. Nothing aggravates the symptoms. Treatments tried: OCPs and amytripaline. Midol.  The treatment provided no relief. She uses oral contraceptives for contraception. Menstrual history: 09/23/16.   Past Medical History:  Diagnosis Date  . Gallstones   . UTI (lower urinary tract infection)     Past Surgical History:  Procedure Laterality Date  . CHOLECYSTECTOMY    . DILATION AND CURETTAGE OF UTERUS      History reviewed. No pertinent family history.  Social History  Substance Use Topics  . Smoking status: Never Smoker  . Smokeless tobacco: Never Used  . Alcohol use No    Allergies: No Known Allergies  Prescriptions Prior to Admission  Medication Sig Dispense Refill Last Dose  . Norethindrone Acetate-Ethinyl Estrad-FE (LOESTRIN 24 FE) 1-20 MG-MCG(24) tablet Take 1 tablet by mouth daily. 1 Package 11 10/31/2016 at Unknown time  . amitriptyline (ELAVIL) 50 MG tablet Take 1 tablet (50 mg total) by mouth at bedtime. 30 tablet 1 More than a month at Unknown time  . metroNIDAZOLE (FLAGYL) 500 MG tablet Take 1 tablet (500 mg total) by mouth 2 (two) times daily. (Patient not taking: Reported on  10/06/2016) 14 tablet 0 Unknown at Unknown time    Review of Systems  Constitutional: Negative for chills and fever.  Gastrointestinal: Positive for nausea. Negative for vomiting.  Genitourinary: Positive for pelvic pain and vaginal bleeding.   Physical Exam   Blood pressure 118/67, pulse 77, temperature 98 F (36.7 C), resp. rate 16, last menstrual period 10/20/2016, SpO2 100 %.  Physical Exam  Nursing note and vitals reviewed. Constitutional: She is oriented to person, place, and time. She appears well-developed and well-nourished. No distress.  HENT:  Head: Normocephalic.  Cardiovascular: Normal rate.   Respiratory: Effort normal.  GI: Soft. There is tenderness (midline ). There is no rebound.  Neurological: She is alert and oriented to person, place, and time.  Skin: Skin is warm and dry.  Psychiatric: She has a normal mood and affect.     Results for orders placed or performed during the hospital encounter of 10/31/16 (from the past 24 hour(s))  Urinalysis, Routine w reflex microscopic     Status: Abnormal   Collection Time: 10/31/16  1:04 AM  Result Value Ref Range   Color, Urine YELLOW YELLOW   APPearance CLOUDY (A) CLEAR   Specific Gravity, Urine 1.011 1.005 - 1.030   pH 7.0 5.0 - 8.0   Glucose, UA NEGATIVE NEGATIVE mg/dL   Hgb urine dipstick LARGE (A) NEGATIVE   Bilirubin Urine NEGATIVE NEGATIVE   Ketones, ur NEGATIVE NEGATIVE mg/dL   Protein, ur NEGATIVE NEGATIVE mg/dL   Nitrite NEGATIVE NEGATIVE   Leukocytes, UA NEGATIVE NEGATIVE  RBC / HPF TOO NUMEROUS TO COUNT 0 - 5 RBC/hpf   WBC, UA 6-30 0 - 5 WBC/hpf   Bacteria, UA RARE (A) NONE SEEN   Squamous Epithelial / LPF 0-5 (A) NONE SEEN   Mucous PRESENT   Pregnancy, urine POC     Status: None   Collection Time: 10/31/16  1:47 AM  Result Value Ref Range   Preg Test, Ur NEGATIVE NEGATIVE  CBC     Status: Abnormal   Collection Time: 10/31/16  2:28 AM  Result Value Ref Range   WBC 9.4 4.0 - 10.5 K/uL   RBC  3.68 (L) 3.87 - 5.11 MIL/uL   Hemoglobin 10.9 (L) 12.0 - 15.0 g/dL   HCT 40.9 (L) 81.1 - 91.4 %   MCV 87.2 78.0 - 100.0 fL   MCH 29.6 26.0 - 34.0 pg   MCHC 34.0 30.0 - 36.0 g/dL   RDW 78.2 95.6 - 21.3 %   Platelets 380 150 - 400 K/uL     MAU Course  Procedures  MDM Patient has had toradol, and she reports that her pain is better.   Assessment and Plan   1. Pelvic pain   2. Abnormal uterine bleeding    DC home Comfort measures reviewed  Bleeding precautions RX: ibuprofen  PRN #[id number]  Return to MAU as needed FU with OB as planned  Follow-up Information    Willodean Rosenthal, MD Follow up.   Specialty:  Obstetrics and Gynecology Contact information: [address] [place] [address] [phone]            Tawnya Crook 10/31/2016, 2:19 AM

## 2016-10-31 NOTE — Discharge Instructions (Signed)
Sangrado uterino anormal °(Abnormal Uterine Bleeding) °Sangrado uterino anormal significa que hay un sangrado por la vagina que no es su período menstrual normal. Puede ser: °· Pérdidas de sangre o hemorragias entre los períodos. °· Hemorragias luego de tener sexo (relaciones sexuales). °· Sangrado abundante o más que lo habitual. °· Períodos que duran más que lo normal. °· Sangrado luego de la menopausia. °Hay muchos problemas que pueden ser la causa. El tratamiento dependerá de la causa del sangrado. Cualquier tipo de sangrado que no sea normal debe consultarse con el médico. °CUIDADOS EN EL HOGAR °Controle su afección para ver si hay cambios. Estas indicaciones podrán disminuir cualquier molestia que tenga: °· No use tampones ni duchas vaginales o como le haya indicado el médico. °· Cambie los apósitos con frecuencia. °Deberá hacerse exámenes pélvicos regulares y pruebas de [place]apan[place]ola[place]. Realice los estudios indicados según le indique su médico. °SOLICITE AYUDA SI: °· El sangrado dura más de 1 semana. °· Se siente mareada por momentos. ° °SOLICITE AYUDA DE INMEDIATO SI: °· Se desmaya. °· Tiene que cambiarse los apósitos cada 15 a 30 minutos. °· Siente dolor en el abdomen. °· Tiene fiebre. °· Se siente débil o presenta sudoración. °· Elimina coágulos grandes por la vagina. °· Siente malestar estomacal (náuseas) y devuelve (vomita). ° °ASEGÚRESE DE QUE: °· Comprende estas instrucciones. °· Controlará su afección. °· Recibirá ayuda de inmediato si no mejora o si empeora. ° °Esta información no tiene como fin reemplazar el consejo del médico. Asegúrese de hacerle al médico cualquier pregunta que tenga. °Document Released: 08/12/2010 Document Revised: 07/15/2013 Document Reviewed: 02/06/2013 °Elsevier Interactive Patient Education © 2017 Elsevier Inc. ° °

## 2016-11-01 ENCOUNTER — Telehealth: Payer: Self-pay | Admitting: General Practice

## 2016-11-01 NOTE — Telephone Encounter (Signed)
Per Alanda Amass, patient requests call back concerning AUB. Called patient with [employer] interpreter [phone], no answer- left message that we are trying to reach you to return your phone call, please call back. Per chart review, patient was seen in MAU for concern yesterday

## 2016-11-02 NOTE — Telephone Encounter (Signed)
Enter in error

## 2016-11-14 ENCOUNTER — Ambulatory Visit (INDEPENDENT_AMBULATORY_CARE_PROVIDER_SITE_OTHER): Payer: Self-pay | Admitting: Obstetrics & Gynecology

## 2016-11-14 ENCOUNTER — Encounter: Payer: Self-pay | Admitting: Obstetrics & Gynecology

## 2016-11-14 ENCOUNTER — Other Ambulatory Visit (HOSPITAL_COMMUNITY)
Admission: RE | Admit: 2016-11-14 | Discharge: 2016-11-14 | Disposition: A | Discharge status: Home / Self Care | Payer: Self-pay | Source: Ambulatory Visit | Attending: Obstetrics & Gynecology | Admitting: Obstetrics & Gynecology

## 2016-11-14 DIAGNOSIS — R301 Vesical tenesmus: Secondary | ICD-10-CM

## 2016-11-14 DIAGNOSIS — N939 Abnormal uterine and vaginal bleeding, unspecified: Secondary | ICD-10-CM | POA: Insufficient documentation

## 2016-11-14 DIAGNOSIS — Z3202 Encounter for pregnancy test, result negative: Secondary | ICD-10-CM

## 2016-11-14 LAB — POCT PREGNANCY, URINE: Preg Test, Ur: NEGATIVE

## 2016-11-14 MED ORDER — NORETHIN ACE-ETH ESTRAD-FE 1-20 MG-MCG(24) PO TABS: 1.0000 | ORAL_TABLET | Freq: Every day | ORAL | 11 refills | Status: DC

## 2016-11-14 NOTE — Patient Instructions (Signed)
Biopsia de endometrio - Cuidados posteriores  (Endometrial Biopsy, Care After)  Siga estas instrucciones durante las próximas semanas. Estas indicaciones le proporcionan información general acerca de cómo deberá cuidarse después del procedimiento. El médico también podrá darle instrucciones más específicas. El tratamiento se ha planificado de acuerdo a las prácticas médicas actuales, pero a veces se producen problemas. Comuníquese con el médico si tiene algún problema o tiene dudas después del procedimiento.  QUÉ ESPERAR DESPUÉS DEL PROCEDIMIENTO  Después del procedimiento, es típico tener las siguientes sensaciones:  · Sentirá cólicos leves y tendrá una pequeña cantidad de sangrado vaginal durante algunos días después del procedimiento. Esto es normal.  INSTRUCCIONES PARA EL CUIDADO EN EL HOGAR  · Tome sólo medicamentos de venta libre o recetados, según las indicaciones del médico.  · No utilice tampones, duchas vaginales ni tenga relaciones sexuales hasta que el profesional la autorice.  · Siga las indicaciones del médico relacionadas con la restricción a ciertas actividades, como ejercicios físicos intensos o levantar objetos pesados.    SOLICITE ATENCIÓN MÉDICA SI:  · Tiene un sangrado abundante o sangra durante más de 2 días después del procedimiento.  · Advierte un olor fétido que proviene de la vagina.  · Siente escalofríos o tiene fiebre.  · Siente un dolor en el bajo vientre (abdominal) muy intenso.    SOLICITE ATENCIÓN MÉDICA DE INMEDIATO SI:  · Siente cólicos intensos en el estómago o en la espalda.  · Elimina coágulos grandes.  · La hemorragia aumenta.  · Se siente mareada, débil, o se desmaya.    Esta información no tiene como fin reemplazar el consejo del médico. Asegúrese de hacerle al médico cualquier pregunta que tenga.  Document Released: 04/30/2013 Document Revised: 04/30/2013 Document Reviewed: 12/25/2012  Elsevier Interactive Patient Education © 2017 Elsevier Inc.

## 2016-11-14 NOTE — Progress Notes (Signed)
History:  41 y.o. [id number] here today for eval of heavy bleeding and pain that was severe. Mar 28th-Apr 15. This was during the time of her reg cycle but, this pain was worse and the bleeding was heavier.  It was assoc with large clots. This was the longest cycle the pt has ever had.  Pt   Had a neg UPT in the ED.  Pt had a BTL.   Pt was on meds for her bladder but, they caused reflux. She has stopped the meds and her sx have not returned.     The following portions of the patient's history were reviewed and updated as appropriate: allergies, current medications, past family history, past medical history, past social history, past surgical history and problem list.  Review of Systems:  Pertinent items are noted in HPI.   Objective:  Physical Exam Blood pressure 107/69, pulse 72, height  (1.575 m), weight 122 lb 14.4 oz (55.7 kg), last menstrual period 10/20/2016. BP 107/69   Pulse 72   Ht  (1.575 m)   Wt 122 lb 14.4 oz (55.7 kg)   LMP 10/20/2016 (Exact Date)   BMI 22.48 kg/m  CONSTITUTIONAL: Well-developed, well-nourished female in no acute distress.  HENT:  Normocephalic, atraumatic EYES: Conjunctivae and EOM are normal. No scleral icterus.  NECK: Normal range of motion SKIN: Skin is warm and dry. No rash noted. Not diaphoretic.No pallor. NEUROLGIC: Alert and oriented to person, place, and time. Normal coordination.   Abd: Soft, nontender and nondistended Pelvic: Normal appearing external genitalia; normal appearing vaginal mucosa and cervix.  Normal discharge.  Small uterus, no other palpable masses, no uterine or adnexal tenderness  The indications for endometrial biopsy were reviewed.   Risks of the biopsy including cramping, bleeding, infection, uterine perforation, inadequate specimen and need for additional procedures  were discussed. The patient states she understands and agrees to undergo procedure today. Consent was signed. Time out was performed. Urine HCG was  negative. A sterile speculum was placed in the patient's vagina and the cervix was prepped with Betadine. A single-toothed tenaculum was placed on the anterior lip of the cervix to stabilize it. The 3 mm pipelle was introduced into the endometrial cavity without difficulty to a depth of 8cm, and a moderate amount of tissue was obtained and sent to pathology. The instruments were removed from the patient's vagina. Minimal bleeding from the cervix was noted. The patient tolerated the procedure well. Routine post-procedure instructions were given to the patient. The patient will follow up to review the results and for further management.      Labs and Imaging 10/13/2016 CLINICAL DATA:  Abnormal uterine bleeding  EXAM: TRANSABDOMINAL AND TRANSVAGINAL ULTRASOUND OF PELVIS  TECHNIQUE: Both transabdominal and transvaginal ultrasound examinations of the pelvis were performed. Transabdominal technique was performed for global imaging of the pelvis including uterus, ovaries, adnexal regions, and pelvic cul-de-sac. It was necessary to proceed with endovaginal exam following the transabdominal exam to visualize the uterus, endometrium, ovaries and adnexa .  COMPARISON:  CT 02/06/2015  FINDINGS: Uterus  Measurements: 6.9 x 4.0 x 4.5 cm. No fibroids or other mass visualized.  Endometrium  Thickness: 16 mm in thickness.  No focal abnormality visualized.  Right ovary  Measurements: 3.3 x 1.6 x 3.1 cm. Normal appearance/no adnexal mass.  Left ovary  Measurements: 4.8 x 1.1 x 1.3 cm. Normal appearance/no adnexal mass.  Other findings  Trace free fluid in the pelvis.  IMPRESSION: Borderline thickened endometrium at 16 mm.  If bleeding remains unresponsive to hormonal or medical therapy, focal lesion work-up with sonohysterogram should be considered. Endometrial biopsy should also be considered in pre-menopausal patients at high risk for endometrial carcinoma. (Ref:  Radiological Reasoning: Algorithmic Workup of Abnormal Vaginal Bleeding with Endovaginal Sonography and Sonohysterography. AJR 2008; 454:U98-11)   Assessment & Plan:  AUB- see [place] and above sx.  Sx  As above.   Keep Loestrin 1/20 1 po q day f/u endo biopsy  Elavil prn for bladder spasms  f/u in 3 months or sooner prn  Kameah Rawl L. Harraway-Smith, M.D., [facility]

## 2016-11-16 ENCOUNTER — Telehealth: Payer: Self-pay | Admitting: *Deleted

## 2016-11-16 NOTE — Telephone Encounter (Signed)
-----   Message from Willodean Rosenthal, MD sent at 11/16/2016  1:00 PM EDT ----- Please call pt. (you will need a Spanish interpreter).  Her endo bx was neg. She should continue the OCPs.     Thx, clh-S

## 2016-11-16 NOTE — Telephone Encounter (Signed)
Attempted to call patient using Spanish interpreter # [phone]. Called home number, spoke with patient's husband and let him know that I am calling with test results. He asked me to call her work number. Attempted to call that number. Message received that pt was unavailable. No voice mail set up. Will try to call later.

## 2016-11-20 NOTE — Telephone Encounter (Signed)
Attempted to call patient using Spanish interpreter [place]. Patient's phone rang continuously, no answer, no voice mail.

## 2016-12-07 ENCOUNTER — Encounter: Payer: Self-pay | Admitting: General Practice

## 2016-12-07 NOTE — Telephone Encounter (Signed)
Multiple attempts made. Will send letter

## 2017-06-28 IMAGING — CT CT ABD-PELV W/ CM
2 of 5 series · 11 of 46 positions shown, 12 images · IV contrast (Iodine)
Comparison: CT abdomen and pelvis December 03, 2008

CLINICAL DATA: Mid upper abdominal pain for 3 days, nausea,
vomiting and diarrhea. History of gallstones and urinary tract
infection.

EXAM:
CT ABDOMEN AND PELVIS WITH CONTRAST
TECHNIQUE: Multidetector CT imaging of the abdomen and pelvis was performed
using the standard protocol following bolus administration of
intravenous contrast.
CONTRAST:  80mL OMNIPAQUE IOHEXOL 300 MG/ML  SOLN

[Series 201: routine, idose (2) · axial · 0.75mm/px · z∈[-409,-69]mm · 8 of 88 slices shown, 9 images]
[im 10/88  soft-tissue]
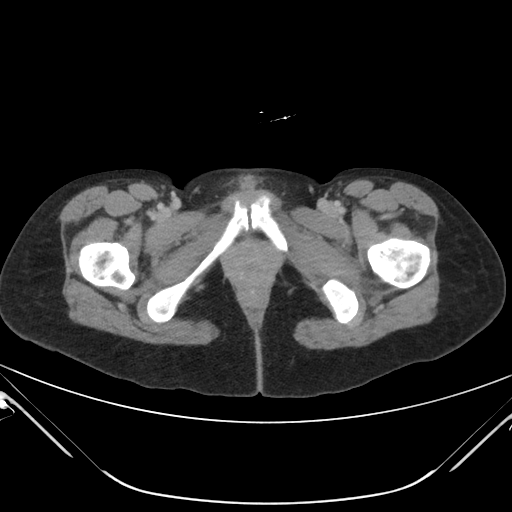
[im 10/88  bone]
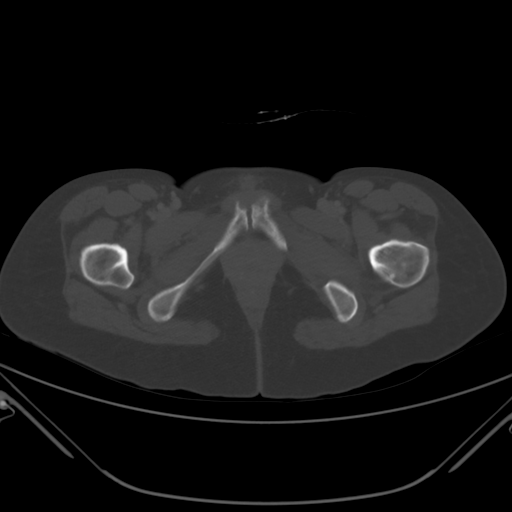
[im 20/88  soft-tissue]
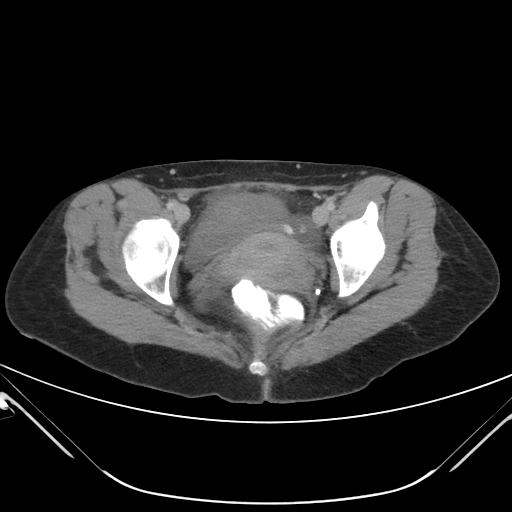
[im 30/88  soft-tissue]
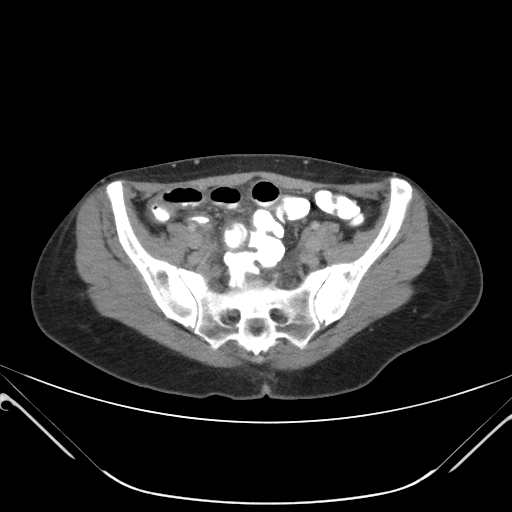
[im 39/88  soft-tissue]
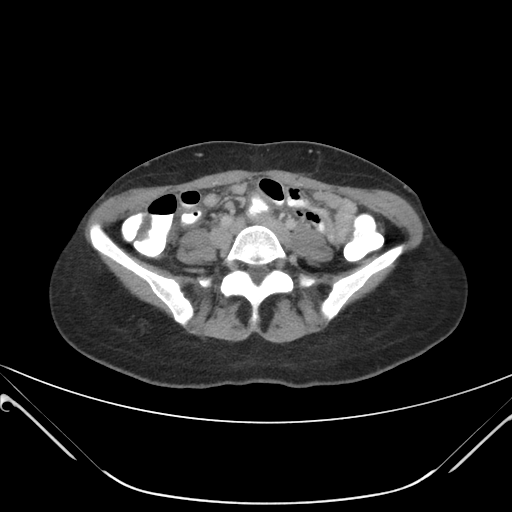
[im 49/88  soft-tissue]
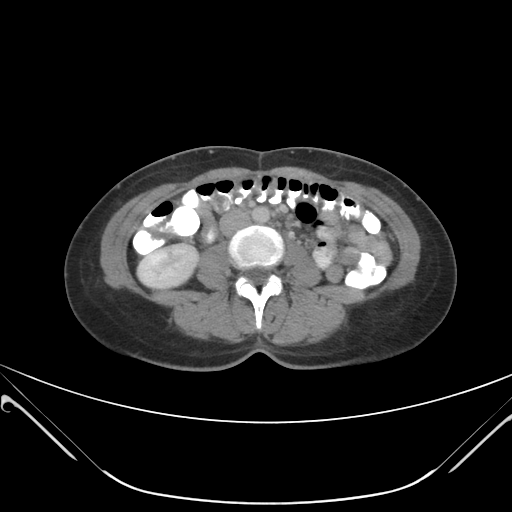
[im 59/88  soft-tissue]
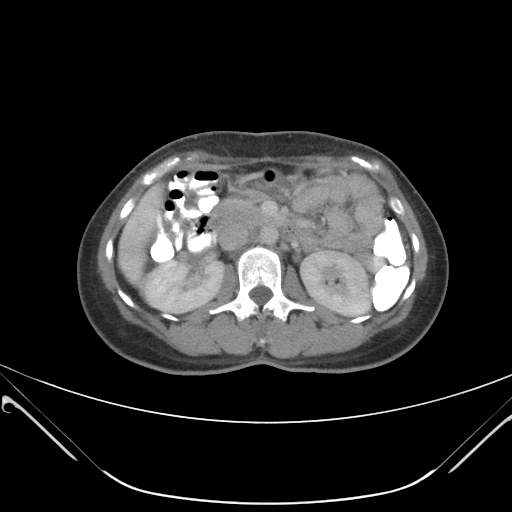
[im 68/88  soft-tissue]
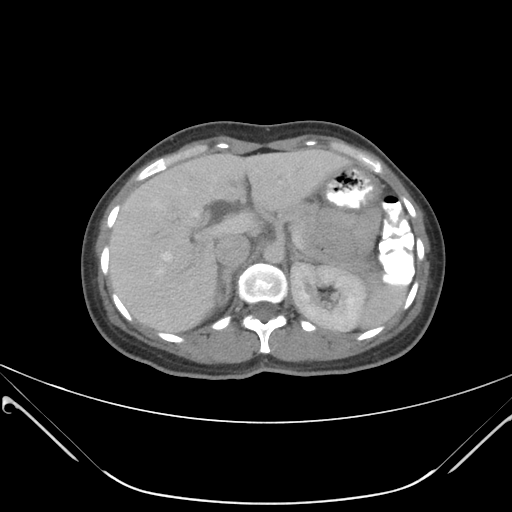
[im 78/88  soft-tissue]
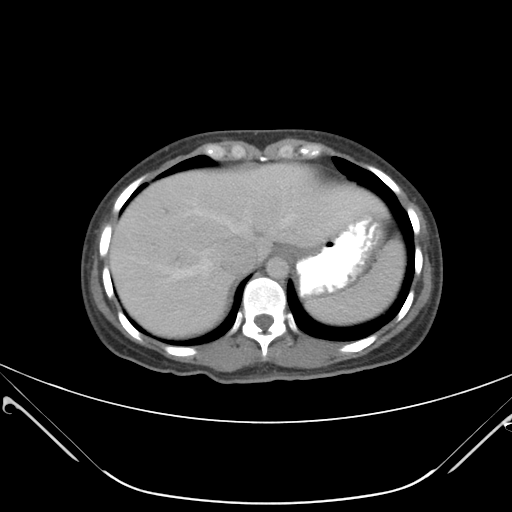

[Series 203: coronals, idose (2) · coronal · 0.45mm/px · 3 of 90 slices shown]
[im 30/90  soft-tissue]
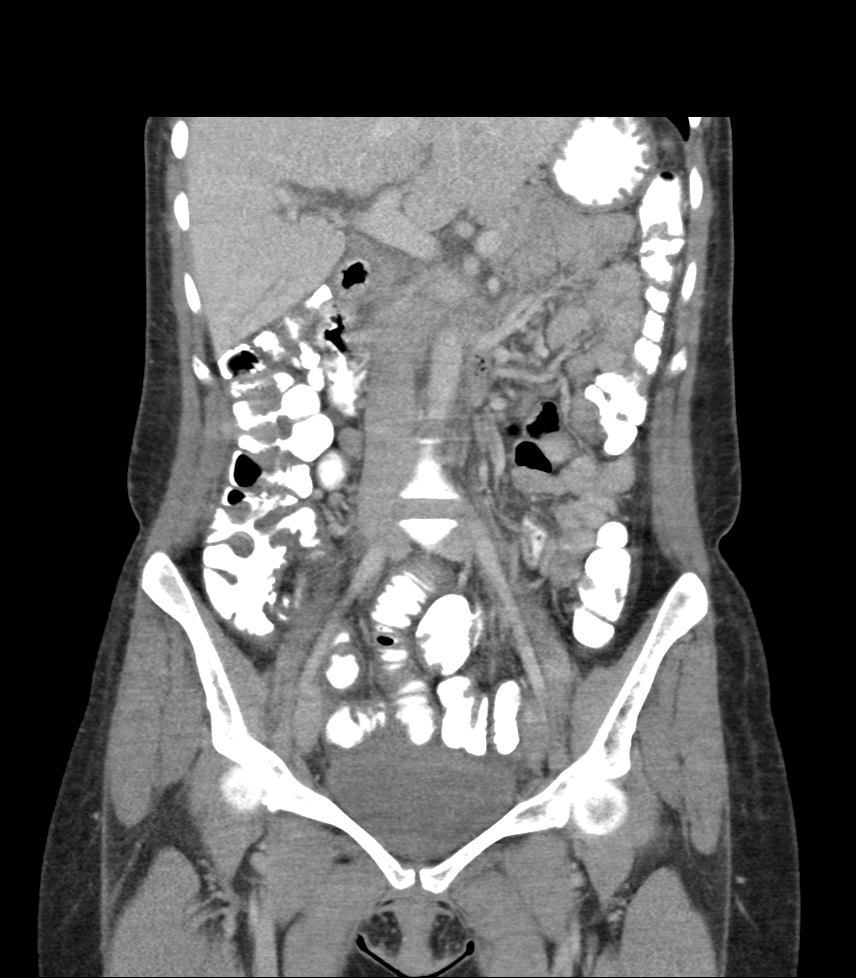
[im 40/90  soft-tissue]
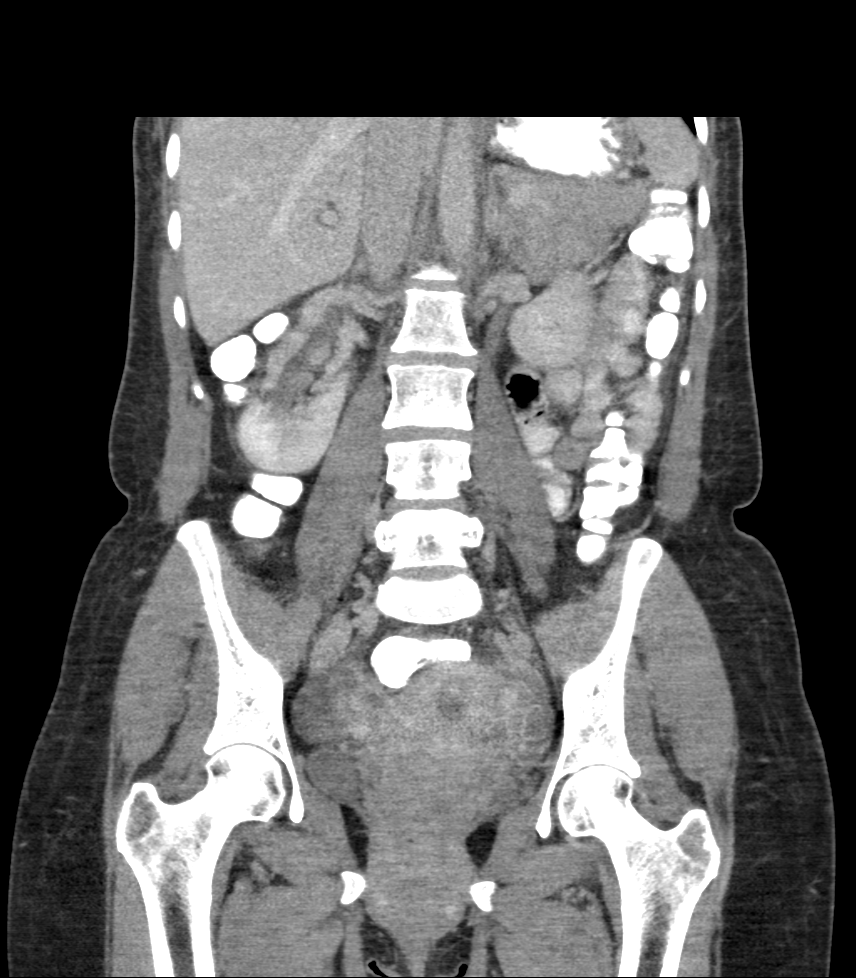
[im 50/90  soft-tissue]
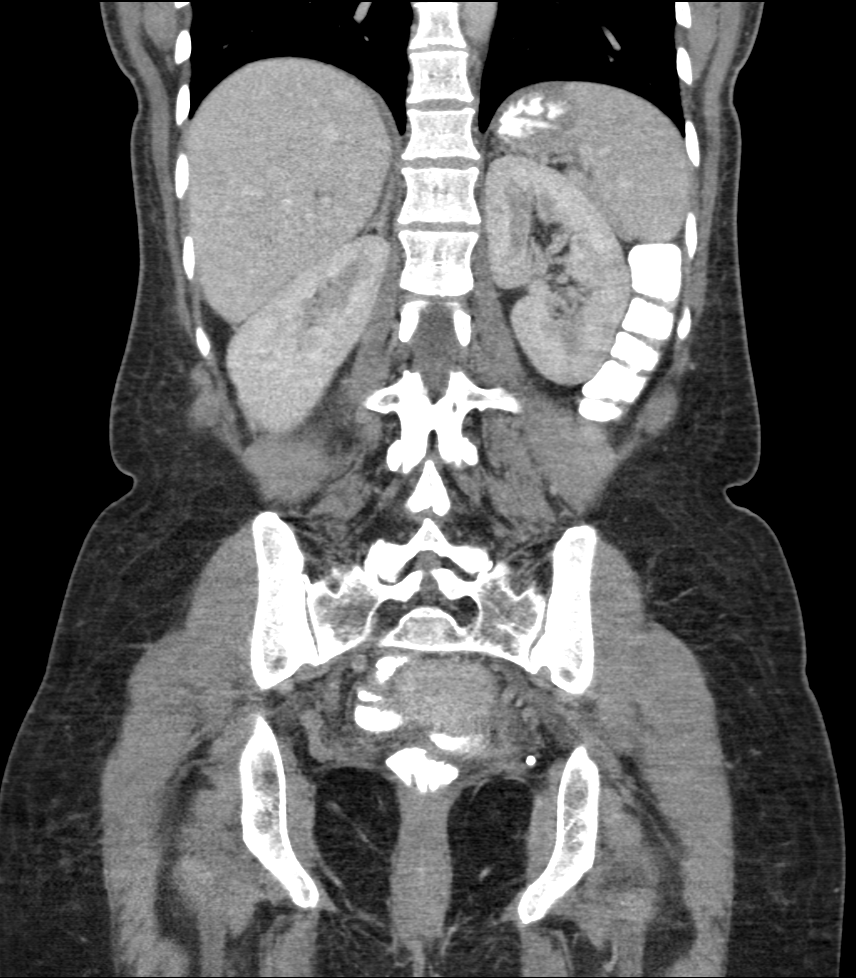

[11 of 46 positions shown; findings below may reference images not displayed]

FINDINGS: LUNG BASES: Included view of the lung bases are clear. Visualized
heart and pericardium are unremarkable.

SOLID ORGANS: The spleen, pancreas and adrenal glands are
unremarkable. Status post cholecystectomy Common bile duct is 9 mm.
Similar moderate intrahepatic biliary dilatation, no CT findings of
choledocholithiasis.

GASTROINTESTINAL TRACT: Distal stomach apparent intramural gas level
axial 29/88 though, not apparent on delayed phase. The stomach,
small and large bowel are normal in course and caliber without
inflammatory changes. Normal appendix.

KIDNEYS/ URINARY TRACT: Kidneys are orthotopic, demonstrating
symmetric enhancement. Mildly malrotated RIGHT kidney with possible
partial duplication, limited assessment. Too small to characterize
RIGHT lower pole hypodensity. No nephrolithiasis, hydronephrosis or
solid renal masses. The unopacified ureters are normal in course and
caliber. Delayed imaging through the kidneys demonstrates symmetric
prompt contrast excretion within the proximal urinary collecting
system. Urinary bladder is partially distended and unremarkable.

PERITONEUM/RETROPERITONEUM: Aortoiliac vessels are normal in course
and caliber. No lymphadenopathy by CT size criteria. Internal
reproductive organs are unremarkable. No intraperitoneal free fluid
nor free air.

SOFT TISSUE/OSSEOUS STRUCTURES: Non-suspicious.
IMPRESSION: Possible distal stomach non perforated ulcer.

Similar biliary dilatation, status post cholecystectomy without CT
findings of choledocholithiasis.

## 2017-08-27 ENCOUNTER — Inpatient Hospital Stay (HOSPITAL_COMMUNITY)
Admission: AD | Admit: 2017-08-27 | Discharge: 2017-08-27 | Disposition: A | Discharge status: Home / Self Care | Payer: Self-pay | Source: Ambulatory Visit | Attending: Obstetrics & Gynecology | Admitting: Obstetrics & Gynecology

## 2017-08-27 ENCOUNTER — Encounter (HOSPITAL_COMMUNITY): Payer: Self-pay | Admitting: *Deleted

## 2017-08-27 DIAGNOSIS — Z3202 Encounter for pregnancy test, result negative: Secondary | ICD-10-CM | POA: Insufficient documentation

## 2017-08-27 DIAGNOSIS — N3289 Other specified disorders of bladder: Secondary | ICD-10-CM | POA: Insufficient documentation

## 2017-08-27 DIAGNOSIS — N3001 Acute cystitis with hematuria: Secondary | ICD-10-CM | POA: Insufficient documentation

## 2017-08-27 DIAGNOSIS — R301 Vesical tenesmus: Secondary | ICD-10-CM

## 2017-08-27 LAB — URINALYSIS, ROUTINE W REFLEX MICROSCOPIC
Bacteria, UA: NONE SEEN
Bilirubin Urine: NEGATIVE
Glucose, UA: NEGATIVE mg/dL
Ketones, ur: NEGATIVE mg/dL
Nitrite: NEGATIVE
PROTEIN: 100 mg/dL — AB
SQUAMOUS EPITHELIAL / LPF: NONE SEEN
Specific Gravity, Urine: 1.017 (ref 1.005–1.030)
pH: 5 (ref 5.0–8.0)

## 2017-08-27 LAB — GC/CHLAMYDIA PROBE AMP (~~LOC~~) NOT AT ARMC
Chlamydia: NEGATIVE
NEISSERIA GONORRHEA: NEGATIVE

## 2017-08-27 LAB — WET PREP, GENITAL
Clue Cells Wet Prep HPF POC: NONE SEEN
Sperm: NONE SEEN
Trich, Wet Prep: NONE SEEN
Yeast Wet Prep HPF POC: NONE SEEN

## 2017-08-27 LAB — POCT PREGNANCY, URINE: PREG TEST UR: NEGATIVE

## 2017-08-27 MED ORDER — KETOROLAC TROMETHAMINE 60 MG/2ML IM SOLN
60.0000 mg | Freq: Once | INTRAMUSCULAR | Status: AC
  Administered 2017-08-27: 60 mg via INTRAMUSCULAR
  Filled 2017-08-27: qty 2

## 2017-08-27 MED ORDER — IBUPROFEN 600 MG PO TABS: 600.0000 mg | ORAL_TABLET | Freq: Four times a day (QID) | ORAL | 1 refills | Status: AC | PRN

## 2017-08-27 MED ORDER — PHENAZOPYRIDINE HCL 200 MG PO TABS: 200.0000 mg | ORAL_TABLET | Freq: Three times a day (TID) | ORAL | 1 refills | Status: AC | PRN

## 2017-08-27 MED ORDER — TRAMADOL HCL 50 MG PO TABS: 50.0000 mg | ORAL_TABLET | Freq: Four times a day (QID) | ORAL | 0 refills | Status: AC | PRN

## 2017-08-27 MED ORDER — SULFAMETHOXAZOLE-TRIMETHOPRIM 400-80 MG PO TABS: 1.0000 | ORAL_TABLET | Freq: Two times a day (BID) | ORAL | 0 refills | Status: DC

## 2017-08-27 NOTE — MAU Note (Signed)
PT SAYS SSHE STARTED BURNING  AND BLEEDING  WITH VOIDING   ON Friday.  DID NOT CALL DR.  NOW FEELS ABD PAIN

## 2017-08-27 NOTE — MAU Provider Note (Signed)
Chief Complaint: Dysuria   First Provider Initiated Contact with Patient 08/27/17 0751      SUBJECTIVE HPI: [name] is a 42 y.o. [id number] who presents to maternity admissions reporting burning with urination and suprapubic pain x 3 days.  The abdominal pain is constant, pressure and sharp pain in her low abdomen radiating to right and left sides of her abdomen. She denies back pain.  There are some associated chills starting today.  There is no n/v.  She has taken Tylenol but it has not helped. There are no other associated symptoms.    HPI  Past Medical History:  Diagnosis Date  . Gallstones   . UTI (lower urinary tract infection)    Past Surgical History:  Procedure Laterality Date  . CHOLECYSTECTOMY    . DILATION AND CURETTAGE OF UTERUS    . TUBAL LIGATION     Social History   Socioeconomic History  . Marital status: Single    Spouse name: Not on file  . Number of children: Not on file  . Years of education: Not on file  . Highest education level: Not on file  Social Needs  . Financial resource strain: Not on file  . Food insecurity - worry: Not on file  . Food insecurity - inability: Not on file  . Transportation needs - medical: Not on file  . Transportation needs - non-medical: Not on file  Occupational History  . Not on file  Tobacco Use  . Smoking status: Never Smoker  . Smokeless tobacco: Never Used  Substance and Sexual Activity  . Alcohol use: No  . Drug use: No  . Sexual activity: Yes    Birth control/protection: Surgical    Comment: last intercurse 3 wks ago  Other Topics Concern  . Not on file  Social History Narrative  . Not on file   No current facility-administered medications on file prior to encounter.    Current Outpatient Medications on File Prior to Encounter  Medication Sig Dispense Refill  . amitriptyline (ELAVIL) 50 MG tablet Take 1 tablet (50 mg total) by mouth at bedtime. 30 tablet 1  . ibuprofen (ADVIL,MOTRIN) 800 MG tablet  Take 1 tablet (800 mg total) by mouth every 8 (eight) hours as needed. 30 tablet 0  . metroNIDAZOLE (FLAGYL) 500 MG tablet Take 1 tablet (500 mg total) by mouth 2 (two) times daily. (Patient not taking: Reported on 10/06/2016) 14 tablet 0  . Norethindrone Acetate-Ethinyl Estrad-FE (LOESTRIN 24 FE) 1-20 MG-MCG(24) tablet Take 1 tablet by mouth daily. 1 Package 11   No Known Allergies  ROS:  Review of Systems  Constitutional: Negative for chills, fatigue and fever.  Respiratory: Negative for shortness of breath.   Cardiovascular: Negative for chest pain.  Genitourinary: Positive for pelvic pain. Negative for difficulty urinating, dysuria, flank pain, vaginal bleeding, vaginal discharge and vaginal pain.  Neurological: Negative for dizziness and headaches.  Psychiatric/Behavioral: Negative.      I have reviewed patient's Past Medical Hx, Surgical Hx, Family Hx, Social Hx, medications and allergies.   Physical Exam   Patient Vitals for the past 24 hrs:  BP Temp Temp src Pulse Resp Height Weight  08/27/17 0703 (!) 104/45 97.6 F (36.4 C) Oral 81 20 - -  08/27/17 0656 - - - - - 5\' 1"  (1.549 m) 114 lb 12 oz (52.1 kg)   Constitutional: Well-developed, well-nourished female in no acute distress.  Cardiovascular: normal rate Respiratory: normal effort GI: Abd soft, non-tender. Pos BS  x 4 MS: Extremities nontender, no edema, normal ROM Neurologic: Alert and oriented x 4.  GU: Neg CVAT.  PELVIC EXAM: Wet prep/GCC collected by blind swab    LAB RESULTS Results for orders placed or performed during the hospital encounter of 08/27/17 (from the past 24 hour(s))  Urinalysis, Routine w reflex microscopic     Status: Abnormal   Collection Time: 08/27/17  6:45 AM  Result Value Ref Range   Color, Urine YELLOW YELLOW   APPearance CLOUDY (A) CLEAR   Specific Gravity, Urine 1.017 1.005 - 1.030   pH 5.0 5.0 - 8.0   Glucose, UA NEGATIVE NEGATIVE mg/dL   Hgb urine dipstick LARGE (A) NEGATIVE    Bilirubin Urine NEGATIVE NEGATIVE   Ketones, ur NEGATIVE NEGATIVE mg/dL   Protein, ur 161100 (A) NEGATIVE mg/dL   Nitrite NEGATIVE NEGATIVE   Leukocytes, UA LARGE (A) NEGATIVE   RBC / HPF TOO NUMEROUS TO COUNT 0 - 5 RBC/hpf   WBC, UA TOO NUMEROUS TO COUNT 0 - 5 WBC/hpf   Bacteria, UA NONE SEEN NONE SEEN   Squamous Epithelial / LPF NONE SEEN NONE SEEN   WBC Clumps PRESENT    Mucus PRESENT   Pregnancy, urine POC     Status: None   Collection Time: 08/27/17  7:01 AM  Result Value Ref Range   Preg Test, Ur NEGATIVE NEGATIVE       IMAGING No results found.  MAU Management/MDM: Ordered labs and reviewed results.  UA consistent with UTI. No evidence of pyelonephritis. No acute abdomen.  Treatments in MAU included Toradol 60 mg IM.  Rx for Bactrim DS BID x 7 days.  Rx for ibuprofen 600 mg Q 6 hours PRN, Tramadol 50 mg Q 6 hours PRN x 10 tabs, and pyridium 200 mg TID prn.  Pt discharged with strict infection precautions.  ASSESSMENT 1. Acute cystitis with hematuria   2. Painful bladder spasm     PLAN Discharge home Allergies as of 08/27/2017   No Known Allergies     Medication List    STOP taking these medications   metroNIDAZOLE 500 MG tablet Commonly known as:  FLAGYL   Norethindrone Acetate-Ethinyl Estrad-FE 1-20 MG-MCG(24) tablet Commonly known as:  LOESTRIN 24 FE     TAKE these medications   amitriptyline 50 MG tablet Commonly known as:  ELAVIL Take 1 tablet (50 mg total) by mouth at bedtime.   ibuprofen 600 MG tablet Commonly known as:  ADVIL,MOTRIN Take 1 tablet (600 mg total) by mouth every 6 (six) hours as needed. What changed:    medication strength  how much to take  when to take this   phenazopyridine 200 MG tablet Commonly known as:  PYRIDIUM Take 1 tablet (200 mg total) by mouth 3 (three) times daily as needed for pain (urethral spasm).   sulfamethoxazole-trimethoprim 400-80 MG tablet Commonly known as:  BACTRIM Take 1 tablet by mouth 2 (two) times  daily for 7 days.   traMADol 50 MG tablet Commonly known as:  ULTRAM Take 1 tablet (50 mg total) by mouth every 6 (six) hours as needed.      Follow-up Information    Urgent Care or your primary care provider Follow up.   Why:  As needed, return to MAU or emergency room as needed          Sharen CounterLisa Leftwich-Kirby Certified Nurse-Midwife 08/27/2017  8:40 AM

## 2017-08-27 NOTE — MAU Note (Signed)
Vaginal swabs collected by RN

## 2017-08-29 LAB — URINE CULTURE: Culture: >=100000 — AB

## 2017-09-01 ENCOUNTER — Encounter (HOSPITAL_COMMUNITY): Payer: Self-pay | Admitting: *Deleted

## 2017-09-01 ENCOUNTER — Inpatient Hospital Stay (HOSPITAL_COMMUNITY)
Admission: AD | Admit: 2017-09-01 | Discharge: 2017-09-02 | Disposition: A | Discharge status: Home / Self Care | Payer: Self-pay | Source: Ambulatory Visit | Attending: Obstetrics and Gynecology | Admitting: Obstetrics and Gynecology

## 2017-09-01 ENCOUNTER — Other Ambulatory Visit: Payer: Self-pay

## 2017-09-01 DIAGNOSIS — J09X2 Influenza due to identified novel influenza A virus with other respiratory manifestations: Secondary | ICD-10-CM | POA: Insufficient documentation

## 2017-09-01 DIAGNOSIS — R945 Abnormal results of liver function studies: Secondary | ICD-10-CM

## 2017-09-01 DIAGNOSIS — R7989 Other specified abnormal findings of blood chemistry: Secondary | ICD-10-CM

## 2017-09-01 DIAGNOSIS — Z79899 Other long term (current) drug therapy: Secondary | ICD-10-CM | POA: Insufficient documentation

## 2017-09-01 DIAGNOSIS — Z9049 Acquired absence of other specified parts of digestive tract: Secondary | ICD-10-CM | POA: Insufficient documentation

## 2017-09-01 DIAGNOSIS — N3000 Acute cystitis without hematuria: Secondary | ICD-10-CM

## 2017-09-01 DIAGNOSIS — J101 Influenza due to other identified influenza virus with other respiratory manifestations: Secondary | ICD-10-CM

## 2017-09-01 MED ORDER — DEXTROSE 5 % IV SOLN
1.0000 g | Freq: Once | INTRAVENOUS | Status: AC
  Administered 2017-09-02: 1 g via INTRAVENOUS
  Filled 2017-09-01: qty 10

## 2017-09-01 MED ORDER — SODIUM CHLORIDE 0.9 % IV BOLUS (SEPSIS)
1000.0000 mL | Freq: Once | INTRAVENOUS | Status: AC
  Administered 2017-09-01: 1000 mL via INTRAVENOUS

## 2017-09-01 MED ORDER — ACETAMINOPHEN 500 MG PO TABS
1000.0000 mg | ORAL_TABLET | Freq: Once | ORAL | Status: AC
  Administered 2017-09-02: 1000 mg via ORAL
  Filled 2017-09-01: qty 2

## 2017-09-01 MED ORDER — SODIUM CHLORIDE 0.9 % IV SOLN
8.0000 mg | Freq: Once | INTRAVENOUS | Status: AC
  Administered 2017-09-01: 8 mg via INTRAVENOUS
  Filled 2017-09-01: qty 4

## 2017-09-01 NOTE — MAU Note (Signed)
Urine sent to lab 

## 2017-09-01 NOTE — MAU Note (Signed)
Seen MAU 4 days ago and told had uti. Taking antibiotics. Having n/v/d. Some sharp chest pain over heart area. Chills. Headache for 2 days. Body aches

## 2017-09-01 NOTE — MAU Provider Note (Signed)
History     CSN: [id number]  Arrival date and time: 09/01/17 2205   First Provider Initiated Contact with Patient 09/01/17 2255      Chief Complaint  Patient presents with  . Fever  . Nausea  . Headache  . Chest Pain   HPI [name] is a 42 y.o. non pregnant female who presents with fever/chills, headache, body aches and chest pain. Symptoms began yesterday. States her son is sick with similar symptoms. Was seen in MAU on 2/4 & diagnosed with UTI; has been taking macrobid with minimal relief of urinary symptoms. Denies hematuria. Endorses left flank pain. States fever has been up and down today, up to 102. Reports headache and body aches; rates pain 10/10 & describes as aching. Has taken ibuprofen & dayquil without relief. Endorses nausea & vomiting; has vomited all day. No abdominal pain, diarrhea, or constipation. Has non productive cough & mild sore throat. With episodes of chills, feels like her heart is racing and when this occurs she has sharp chest pain in her left chest. No SOB. Denies cardiac history or cardiac history in her family.    Past Medical History:  Diagnosis Date  . Gallstones   . UTI (lower urinary tract infection)     Past Surgical History:  Procedure Laterality Date  . CHOLECYSTECTOMY    . DILATION AND CURETTAGE OF UTERUS    . TUBAL LIGATION      History reviewed. No pertinent family history.  Social History   Tobacco Use  . Smoking status: Never Smoker  . Smokeless tobacco: Never Used  Substance Use Topics  . Alcohol use: No  . Drug use: No    Allergies: No Known Allergies  Medications Prior to Admission  Medication Sig Dispense Refill Last Dose  . amitriptyline (ELAVIL) 50 MG tablet Take 1 tablet (50 mg total) by mouth at bedtime. 30 tablet 1 08/27/2017 at Unknown time  . ibuprofen (ADVIL,MOTRIN) 600 MG tablet Take 1 tablet (600 mg total) by mouth every 6 (six) hours as needed. 30 tablet 1   . phenazopyridine (PYRIDIUM) 200 MG tablet Take 1  tablet (200 mg total) by mouth 3 (three) times daily as needed for pain (urethral spasm). 10 tablet 1   . sulfamethoxazole-trimethoprim (BACTRIM) 400-80 MG tablet Take 1 tablet by mouth 2 (two) times daily for 7 days. 14 tablet 0   . traMADol (ULTRAM) 50 MG tablet Take 1 tablet (50 mg total) by mouth every 6 (six) hours as needed. 10 tablet 0     Review of Systems  Constitutional: Positive for chills and fever.  HENT: Positive for congestion and sore throat. Negative for ear pain.   Respiratory: Positive for cough. Negative for shortness of breath.   Cardiovascular: Positive for chest pain and palpitations.  Gastrointestinal: Positive for nausea and vomiting. Negative for abdominal pain, constipation and diarrhea.  Genitourinary: Positive for dysuria and flank pain. Negative for hematuria.  Musculoskeletal: Positive for myalgias.  Neurological: Positive for headaches.   Physical Exam   Blood pressure (!) 96/49, pulse 80, temperature 99 F (37.2 C), temperature source Oral, resp. rate 16, height 5\' 1"  (1.549 m), weight 109 lb (49.4 kg), last menstrual period 08/07/2017, SpO2 98 %. Patient Vitals for the past 24 hrs:  BP Temp Temp src Pulse Resp SpO2 Height Weight  09/02/17 0446 - 99 F (37.2 C) Oral 80 16 98 % - -  09/02/17 0358 (!) 96/49 99.2 F (37.3 C) Oral 84 19 98 % - -  09/02/17 0243 (!) 91/47 99.3 F (37.4 C) Oral 82 18 99 % - -  09/02/17 0145 (!) 96/47 99.2 F (37.3 C) Oral 92 - - - -  09/02/17 0126 (!) 82/33 100.3 F (37.9 C) Oral 93 16 97 % - -  09/01/17 2259 - (!) 102.8 F (39.3 C) - - - - - -  09/01/17 2222 (!) 107/58 100 F (37.8 C) - (!) 108 18 97 % 5\' 1"  (1.549 m) 109 lb (49.4 kg)    Physical Exam  Nursing note and vitals reviewed. Constitutional: She is oriented to person, place, and time. She appears well-developed and well-nourished. She appears ill. No distress.  HENT:  Head: Normocephalic and atraumatic.  Nose: Right sinus exhibits no maxillary sinus  tenderness and no frontal sinus tenderness. Left sinus exhibits no maxillary sinus tenderness and no frontal sinus tenderness.  Eyes: Conjunctivae are normal. Right eye exhibits no discharge. Left eye exhibits no discharge. No scleral icterus.  Neck: Normal range of motion.  Cardiovascular: Regular rhythm and normal heart sounds. Tachycardia present.  No murmur heard. Respiratory: Effort normal and breath sounds normal. No accessory muscle usage. No tachypnea. No respiratory distress. She has no wheezes. She exhibits no tenderness.  GI: There is CVA tenderness (left).  Neurological: She is alert and oriented to person, place, and time.  Skin: Skin is warm and dry. She is not diaphoretic.  Psychiatric: She has a normal mood and affect. Her behavior is normal. Judgment and thought content normal.    MAU Course  Procedures Results for orders placed or performed during the hospital encounter of 09/01/17 (from the past 24 hour(s))  Influenza panel by PCR (type A & B)     Status: Abnormal   Collection Time: 09/01/17 11:01 PM  Result Value Ref Range   Influenza A By PCR POSITIVE (A) NEGATIVE   Influenza B By PCR NEGATIVE NEGATIVE  CBC with Differential/Platelet     Status: Abnormal   Collection Time: 09/01/17 11:46 PM  Result Value Ref Range   WBC 5.1 4.0 - 10.5 K/uL   RBC 3.62 (L) 3.87 - 5.11 MIL/uL   Hemoglobin 10.6 (L) 12.0 - 15.0 g/dL   HCT 96.031.7 (L) 45.436.0 - 09.846.0 %   MCV 87.6 78.0 - 100.0 fL   MCH 29.3 26.0 - 34.0 pg   MCHC 33.4 30.0 - 36.0 g/dL   RDW 11.912.7 14.711.5 - 82.915.5 %   Platelets 262 150 - 400 K/uL   Neutrophils Relative % 82 %   Neutro Abs 4.2 1.7 - 7.7 K/uL   Lymphocytes Relative 13 %   Lymphs Abs 0.6 (L) 0.7 - 4.0 K/uL   Monocytes Relative 5 %   Monocytes Absolute 0.3 0.1 - 1.0 K/uL   Eosinophils Relative 0 %   Eosinophils Absolute 0.0 0.0 - 0.7 K/uL   Basophils Relative 0 %   Basophils Absolute 0.0 0.0 - 0.1 K/uL  Lactic acid, plasma     Status: None   Collection Time:  09/01/17 11:46 PM  Result Value Ref Range   Lactic Acid, Venous 0.8 0.5 - 1.9 mmol/L  Comprehensive metabolic panel     Status: Abnormal   Collection Time: 09/01/17 11:46 PM  Result Value Ref Range   Sodium 131 (L) 135 - 145 mmol/L   Potassium 3.9 3.5 - 5.1 mmol/L   Chloride 99 (L) 101 - 111 mmol/L   CO2 22 22 - 32 mmol/L   Glucose, Bld 110 (H) 65 - 99 mg/dL  BUN 8 6 - 20 mg/dL   Creatinine, Ser 4.09 0.44 - 1.00 mg/dL   Calcium 8.6 (L) 8.9 - 10.3 mg/dL   Total Protein 7.9 6.5 - 8.1 g/dL   Albumin 3.9 3.5 - 5.0 g/dL   AST 96 (H) 15 - 41 U/L   ALT 95 (H) 14 - 54 U/L   Alkaline Phosphatase 114 38 - 126 U/L   Total Bilirubin 1.9 (H) 0.3 - 1.2 mg/dL   GFR calc non Af Amer >60 >60 mL/min   GFR calc Af Amer >60 >60 mL/min   Anion gap 10 5 - 15    MDM UPT negative EKG reviewed with Dr. Virgina Organ (cardiology) -- no f/u needed  UTI with inappropriate abx per urine culture that resulted 2/6 -- reports left flank pain with some left CVAT & n/v. Will give IV ceftriaxone while in MAU.   Flu swab positive for flu A. Initial dose of tamiflu given. Tylenol 1 gm PO given for fever. After tylenol given, CMP resulted showing elevated LFTs. Patient without abdominal pain & benign abdominal exam.   Patient reports continued headache after IV fluids & tylenol. Ibuprofen given with improvement in headache and vital signs  Assessment and Plan  A; 1. Influenza A   2. Acute cystitis without hematuria   3. Elevated LFTs    P: Discharge home Rx tamiflu & bactrim D/c tylenol  D/c macrobid F/u with PCP to reassess LFTs Discussed reasons to return to MAU vs ED  Judeth Horn 09/01/2017, 10:55 PM

## 2017-09-02 DIAGNOSIS — J101 Influenza due to other identified influenza virus with other respiratory manifestations: Secondary | ICD-10-CM

## 2017-09-02 LAB — COMPREHENSIVE METABOLIC PANEL
ALK PHOS: 114 U/L (ref 38–126)
ALT: 95 U/L — AB (ref 14–54)
AST: 96 U/L — AB (ref 15–41)
Albumin: 3.9 g/dL (ref 3.5–5.0)
Anion gap: 10 (ref 5–15)
BUN: 8 mg/dL (ref 6–20)
CALCIUM: 8.6 mg/dL — AB (ref 8.9–10.3)
CHLORIDE: 99 mmol/L — AB (ref 101–111)
CO2: 22 mmol/L (ref 22–32)
CREATININE: 0.69 mg/dL (ref 0.44–1.00)
GFR calc Af Amer: >60 mL/min (ref >60)
Glucose, Bld: 110 mg/dL — ABNORMAL HIGH (ref 65–99)
Potassium: 3.9 mmol/L (ref 3.5–5.1)
Sodium: 131 mmol/L — ABNORMAL LOW (ref 135–145)
Total Bilirubin: 1.9 mg/dL — ABNORMAL HIGH (ref 0.3–1.2)
Total Protein: 7.9 g/dL (ref 6.5–8.1)

## 2017-09-02 LAB — CBC WITH DIFFERENTIAL/PLATELET
BASOS ABS: 0 10*3/uL (ref 0.0–0.1)
BASOS PCT: 0 %
EOS ABS: 0 10*3/uL (ref 0.0–0.7)
Eosinophils Relative: 0 %
HCT: 31.7 % — ABNORMAL LOW (ref 36.0–46.0)
Hemoglobin: 10.6 g/dL — ABNORMAL LOW (ref 12.0–15.0)
Lymphocytes Relative: 13 %
Lymphs Abs: 0.6 10*3/uL — ABNORMAL LOW (ref 0.7–4.0)
MCH: 29.3 pg (ref 26.0–34.0)
MCHC: 33.4 g/dL (ref 30.0–36.0)
MCV: 87.6 fL (ref 78.0–100.0)
MONO ABS: 0.3 10*3/uL (ref 0.1–1.0)
MONOS PCT: 5 %
Neutro Abs: 4.2 10*3/uL (ref 1.7–7.7)
Neutrophils Relative %: 82 %
PLATELETS: 262 10*3/uL (ref 150–400)
RBC: 3.62 MIL/uL — ABNORMAL LOW (ref 3.87–5.11)
RDW: 12.7 % (ref 11.5–15.5)
WBC: 5.1 10*3/uL (ref 4.0–10.5)

## 2017-09-02 LAB — INFLUENZA PANEL BY PCR (TYPE A & B)
Influenza A By PCR: POSITIVE — AB
Influenza B By PCR: NEGATIVE

## 2017-09-02 LAB — LACTIC ACID, PLASMA: Lactic Acid, Venous: 0.8 mmol/L (ref 0.5–1.9)

## 2017-09-02 MED ORDER — SODIUM CHLORIDE 0.9 % IV BOLUS (SEPSIS)
1000.0000 mL | Freq: Once | INTRAVENOUS | Status: AC
  Administered 2017-09-02: 1000 mL via INTRAVENOUS

## 2017-09-02 MED ORDER — IBUPROFEN 600 MG PO TABS
600.0000 mg | ORAL_TABLET | Freq: Once | ORAL | Status: AC
  Administered 2017-09-02: 600 mg via ORAL
  Filled 2017-09-02: qty 1

## 2017-09-02 MED ORDER — OSELTAMIVIR PHOSPHATE 75 MG PO CAPS: 75.0000 mg | ORAL_CAPSULE | Freq: Two times a day (BID) | ORAL | 0 refills | Status: AC

## 2017-09-02 MED ORDER — OSELTAMIVIR PHOSPHATE 75 MG PO CAPS
75.0000 mg | ORAL_CAPSULE | Freq: Once | ORAL | Status: AC
  Administered 2017-09-02: 75 mg via ORAL
  Filled 2017-09-02: qty 1

## 2017-09-02 MED ORDER — SULFAMETHOXAZOLE-TRIMETHOPRIM 800-160 MG PO TABS: 1.0000 | ORAL_TABLET | Freq: Two times a day (BID) | ORAL | 0 refills | Status: AC

## 2017-09-02 NOTE — Discharge Instructions (Signed)
Call your primary care doctor to check your liver function tests.      Gripe en los adultos (Influenza, Adult) La gripe es una infeccin viral que afecta principalmente las vas respiratorias. Las vas respiratorias incluyen rganos que ayudan a Industrial/product designer, [employer], la nariz y Administrator. La gripe provoca muchos sntomas del resfro comn, as como fiebre alta y Tourist information centre manager. Se transmite fcilmente de persona a persona (es contagiosa). La mejor manera de prevenir la gripe es aplicndose la vacuna contra la gripe todos los aos. CAUSAS La gripe es causada por un virus. Se puede contagiar el virus de las siguientes Merrill:  Al aspirar las gotitas que una persona infectada elimina al toser o Engineering geologist.  Al tocar algo que fue recientemente contaminado con el virus y [facility] mano a la boca, la nariz o los ojos. FACTORES DE RIESGO Los siguientes factores pueden hacer que usted sea propenso a Primary school teacher la gripe:  No lavarse las manos frecuentemente con agua y [place] o un desinfectante de manos a base de alcohol.  Tener contacto cercano con [employer] durante la temporada de resfro y gripe.  Tocarse la boca, los ojos o la nariz sin lavarse ni desinfectarse las manos primero.  No beber suficientes lquidos o no seguir una dieta saludable.  No dormir lo suficiente o no hacer suficiente actividad fsica.  Tener un alto grado de estrs.  No colocarse la vacuna anual contra la gripe. Puede correr un mayor riesgo de complicaciones de la gripe, como una infeccin pulmonar grave (neumona) si:  Tiene ms de 63aos.  Est embarazada.  Tiene un sistema que combate las defensas (sistema inmunitario) debilitado. Puede tener un sistema inmunitario debilitado si: ? Tiene VIH o sida. ? Est recibiendo quimioterapia. ? [place] medicamentos que reducen [employer] (suprimen) del sistema inmunitario.  Tiene una enfermedad a largo plazo (crnica), como cardiopata  coronaria, enfermedad renal, diabetes o enfermedad pulmonar.  Tiene un trastorno heptico.  Son obesas.  Tiene anemia. SNTOMAS Los sntomas de esta afeccin por lo general duran entre 4 y 74das, y Conservator, museum/gallery los siguientes sntomas:  Grant Ruts.  Escalofros.  Dolor de [place], dolores en el cuerpo o dolores musculares.  Dolor de Advertising copywriter.  Tos.  Secrecin o congestin nasal.  Molestias en el pecho y tos.  Prdida del apetito.  Debilidad o cansancio (fatiga).  Mareos.  Nuseas o vmitos. DIAGNSTICO Esta afeccin se puede diagnosticar en funcin de los antecedentes mdicos y un examen fsico. El mdico puede indicarle un cultivo farngeo o nasal para confirmar el diagnstico. TRATAMIENTO Si la gripe se detecta de manera temprana, puede recibir tratamiento con medicamentos antivirales que pueden reducir la duracin de la enfermedad y la gravedad de los sntomas. Este medicamento se puede administrar por va oral o por va intravenosa (IV), es decir, a travs de un tubo que se coloca en una vena. El objetivo del tratamiento es aliviar los sntomas cuidndose en su casa. Esto puede incluir usar medicamentos de venta libre, beber una gran cantidad de lquidos y agregar humedad al aire en su casa. En algunos casos, la gripe se cura sin tratamiento. Los casos graves o las complicaciones de gripe se pueden tratar en un hospital. INSTRUCCIONES PARA EL CUIDADO EN EL [employer] medicamentos de venta libre y los recetados solamente como se lo haya indicado el mdico.  Use un humidificador de aire fro para agregar humedad al aire de su casa. Esto puede ayudarlo a Solicitor.  Descanse todo  lo que sea necesario.  Beba suficiente lquido para Photographermantener la orina clara o de color amarillo plido.  Al toser o estornudar, cbrase la boca y la Heronnariz.  Lvese las manos con agua y jabn frecuentemente, en especial despus de toser o Engineering geologistestornudar. Use desinfectante para manos si no  dispone de [place] y [place].  Lanny HurstQudese en su casa y no concurra al Aleen Campitrabajo o a la escuela, como se lo haya indicado el mdico. A menos que visite al American Expressmdico, evite salir de su casa hasta que no tenga fiebre durante 24horas sin el uso de medicamentos.  Concurra a todas las visitas de control como se lo haya indicado el mdico. Esto es importante.  PREVENCIN  Aplicarse la vacuna anual contra la gripe es la mejor manera de evitar contagiarse la gripe. Puede aplicarse la vacuna contra la gripe a fines de verano, en otoo o en invierno. Pregntele al mdico cundo debe aplicarse la vacuna contra la gripe.  Lvese las manos o use un desinfectante de manos con frecuencia.  Evite el contacto con personas que estn enfermas durante la temporada de resfro y gripe.  Siga una dieta saludable, beba muchos lquidos, duerma lo suficiente y realice actividad fsica con regularidad.  SOLICITE ATENCIN MDICA SI:  Presenta nuevos sntomas.  Tiene los siguientes sntomas: ? Journalist, newspaperDolor en el pecho. ? Diarrea. ? Fiebre.  La tos empeora.  Produce ms mucosidad.  Siente nuseas o vomita.  SOLICITE ATENCIN MDICA DE INMEDIATO SI:  Le falta el aire o tiene dificultad para respirar.  La piel o las uas se tornan de un color Cantonazulado.  Presenta dolor intenso o rigidez de cuello.  Le duele la cabeza de forma repentina o tiene dolor en la cara o el odo.  No puede dejar de vomitar.  Esta informacin no tiene Theme park managercomo fin reemplazar el consejo del mdico. Asegrese de hacerle al mdico cualquier pregunta que tenga. Document Released: 04/19/2005 Document Revised: 11/01/2015 Document Reviewed: 05/04/2015 Elsevier Interactive Patient Education  2017 Elsevier Inc.     Urinary Tract Infection, Adult A urinary tract infection (UTI) is an infection of any part of the urinary tract, which includes the kidneys, ureters, bladder, and urethra. These organs make, store, and get rid of urine in the body. UTI can be a  bladder infection (cystitis) or kidney infection (pyelonephritis). What are the causes? This infection may be caused by fungi, viruses, or bacteria. Bacteria are the most common cause of UTIs. This condition can also be caused by repeated incomplete emptying of the bladder during urination. What increases the risk? This condition is more likely to develop if:  You ignore your need to urinate or hold urine for long periods of time.  You do not empty your bladder completely during urination.  You wipe back to front after urinating or having a bowel movement, if you are female.  You are uncircumcised, if you are female.  You are constipated.  You have a urinary catheter that stays in place (indwelling).  You have a weak defense (immune) system.  You have a medical condition that affects your bowels, kidneys, or bladder.  You have diabetes.  You take antibiotic medicines frequently or for long periods of time, and the antibiotics no longer work well against certain types of infections (antibiotic resistance).  You take medicines that irritate your urinary tract.  You are exposed to chemicals that irritate your urinary tract.  You are female.  What are the signs or symptoms? Symptoms of this condition include:  Fever.  Frequent urination or passing small amounts of urine frequently.  Needing to urinate urgently.  Pain or burning with urination.  Urine that smells bad or unusual.  Cloudy urine.  Pain in the lower abdomen or back.  Trouble urinating.  Blood in the urine.  Vomiting or being less hungry than normal.  Diarrhea or abdominal pain.  Vaginal discharge, if you are female.  How is this diagnosed? This condition is diagnosed with a medical history and physical exam. You will also need to provide a urine sample to test your urine. Other tests may be done, including:  Blood tests.  Sexually transmitted disease (STD) testing.  If you have had more than one  UTI, a cystoscopy or imaging studies may be done to determine the cause of the infections. How is this treated? Treatment for this condition often includes a combination of two or more of the following:  Antibiotic medicine.  Other medicines to treat less common causes of UTI.  Over-the-counter medicines to treat pain.  Drinking enough water to stay hydrated.  Follow these instructions at home:  Take over-the-counter and prescription medicines only as told by your health care provider.  If you were prescribed an antibiotic, take it as told by your health care provider. Do not stop taking the antibiotic even if you start to feel better.  Avoid alcohol, caffeine, tea, and carbonated beverages. They can irritate your bladder.  Drink enough fluid to keep your urine clear or pale yellow.  Keep all follow-up visits as told by your health care provider. This is important.  Make sure to: ? Empty your bladder often and completely. Do not hold urine for long periods of time. ? Empty your bladder before and after sex. ? Wipe from front to back after a bowel movement if you are female. Use each tissue one time when you wipe. Contact a health care provider if:  You have back pain.  You have a fever.  You feel nauseous or vomit.  Your symptoms do not get better after 3 days.  Your symptoms go away and then return. Get help right away if:  You have severe back pain or lower abdominal pain.  You are vomiting and cannot keep down any medicines or water. This information is not intended to replace advice given to you by your health care provider. Make sure you discuss any questions you have with your health care provider. Document Released: 04/19/2005 Document Revised: 12/22/2015 Document Reviewed: 05/31/2015 Elsevier Interactive Patient Education  [employer].

## 2017-09-02 NOTE — Progress Notes (Addendum)
2340: Lt AC PIV established. CBC, CMET and Lactic Acid collected. NS infusing via gravity.  2350: Zofran  8 mg administered.  0008: Rocephin 1g administered  0021: Tylenol 1000 mg administered for headache and joint pains. We will continue to monitor.  0024: Influenza A By PCR resulted positive  0102: Tamiflu capsule 75 mg administered   0143: NS 1  litre bolus administered  0146: ibuprofen 600 mg po administered  For   0214: Up to bathroom  0427: Pt discharged home with prescription sent to her pharmacy. She ambulated off the unit and was accompanied by her husband. Her pain was much improved and all of her questions were answered.

## 2018-02-07 ENCOUNTER — Ambulatory Visit: Payer: Self-pay | Admitting: Obstetrics and Gynecology

## 2018-03-11 ENCOUNTER — Ambulatory Visit: Payer: Self-pay | Admitting: Obstetrics and Gynecology

## 2018-12-05 ENCOUNTER — Other Ambulatory Visit: Payer: Self-pay | Admitting: Obstetrics and Gynecology

## 2018-12-05 DIAGNOSIS — N6320 Unspecified lump in the left breast, unspecified quadrant: Secondary | ICD-10-CM

## 2019-03-05 IMAGING — US US PELVIS COMPLETE
1 series · 15 of 25 positions shown · non-contrast
Comparison: CT 02/06/2015

CLINICAL DATA: Abnormal uterine bleeding

EXAM:
TRANSABDOMINAL AND TRANSVAGINAL ULTRASOUND OF PELVIS
TECHNIQUE: Both transabdominal and transvaginal ultrasound examinations of the
pelvis were performed. Transabdominal technique was performed for
global imaging of the pelvis including uterus, ovaries, adnexal
regions, and pelvic cul-de-sac. It was necessary to proceed with
endovaginal exam following the transabdominal exam to visualize the
uterus, endometrium, ovaries and adnexa .

[Series 1: us pelvis complete · 15 of 71 slices shown]
[im 1/71]
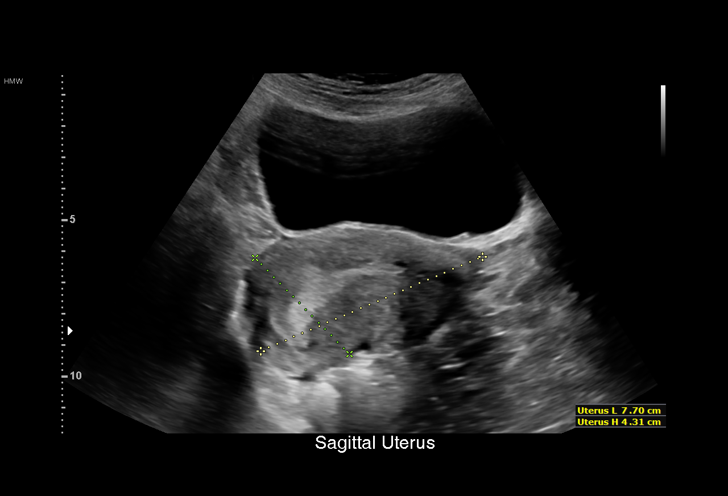
[im 6/71]
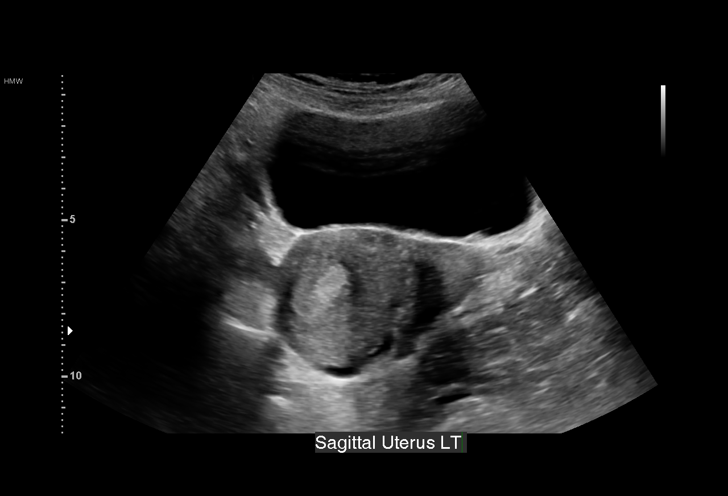
[im 12/71]
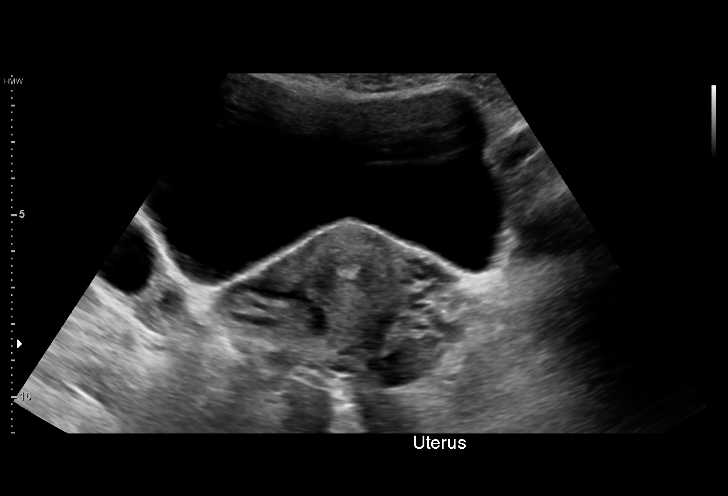
[im 15/71]
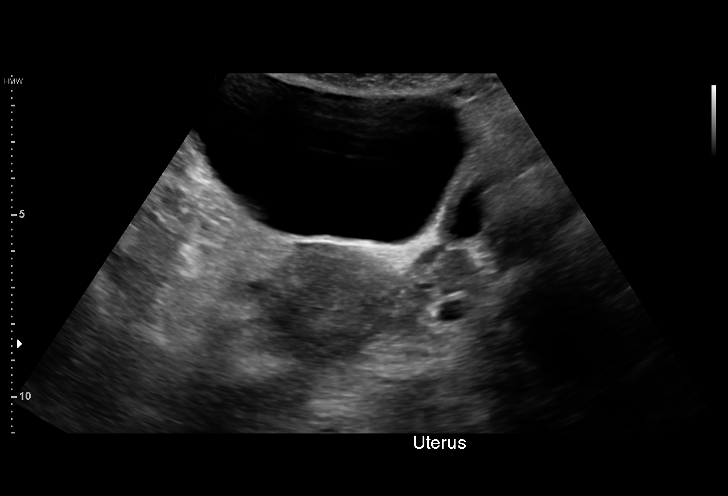
[im 21/71]
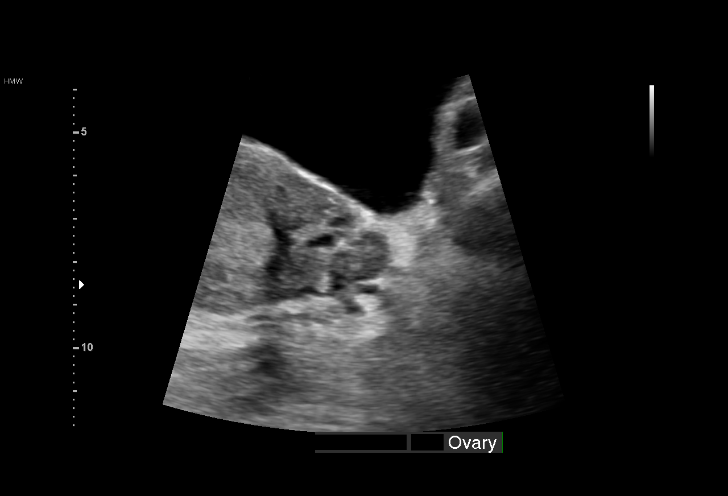
[im 27/71]
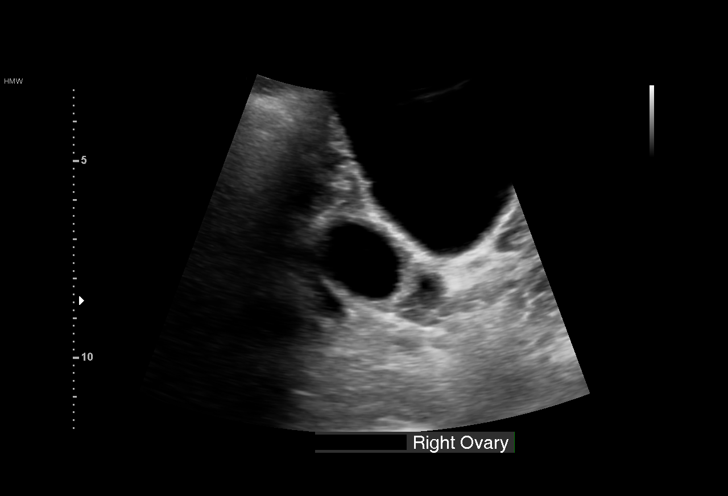
[im 30/71]
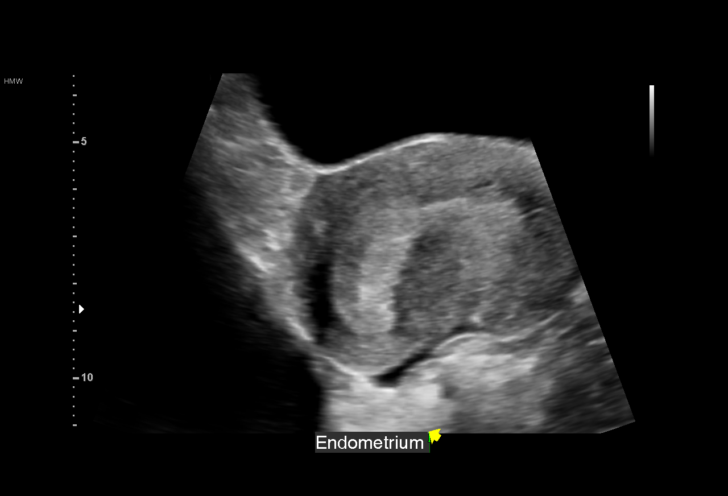
[im 36/71]
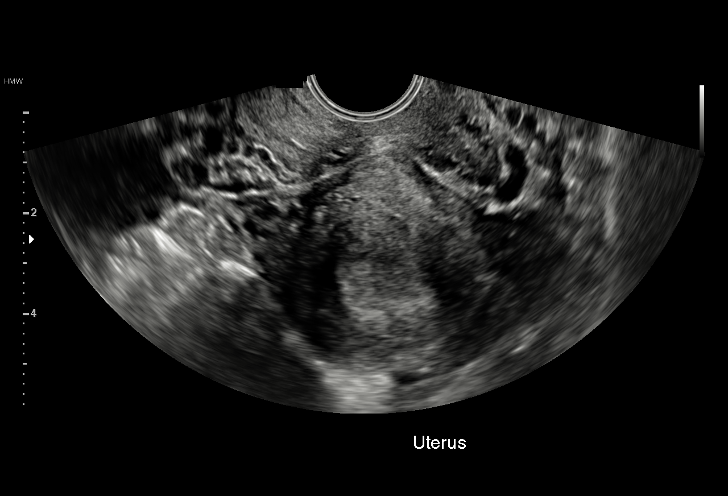
[im 41/71]
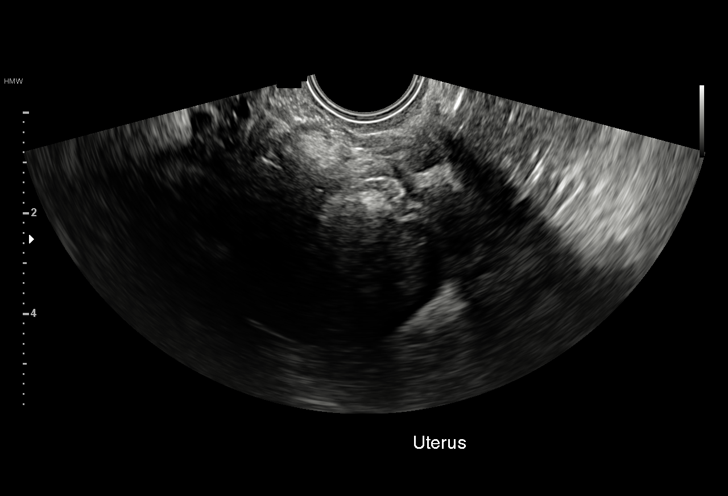
[im 44/71]
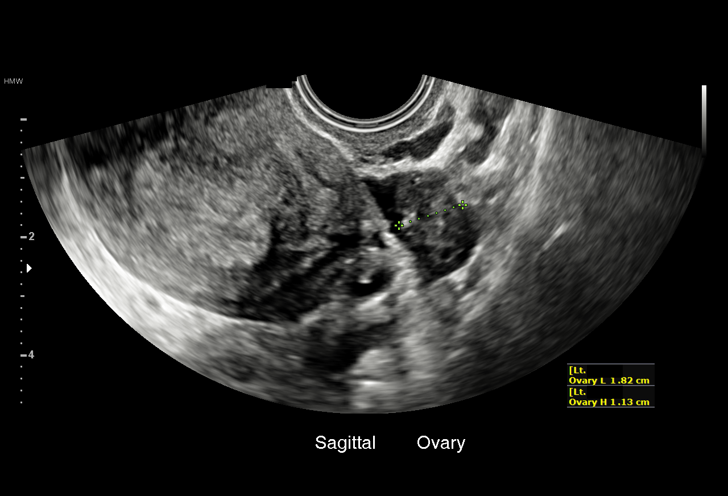
[im 50/71]
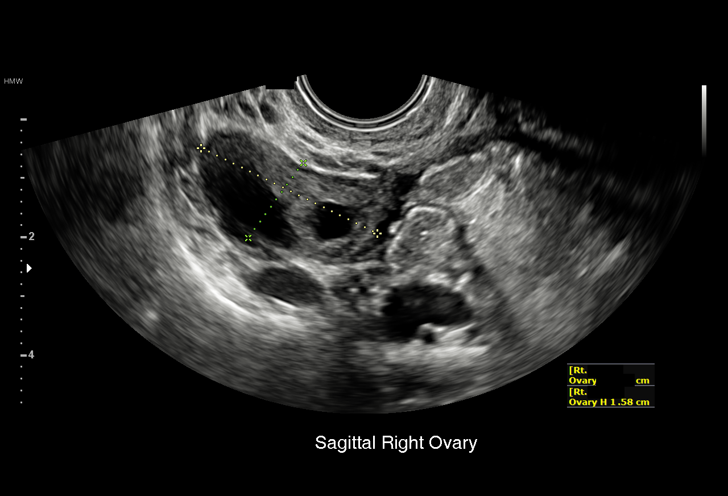
[im 56/71]
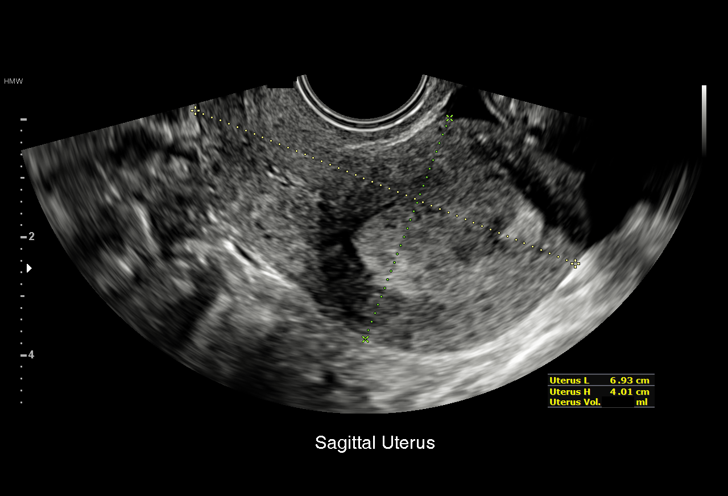
[im 59/71]
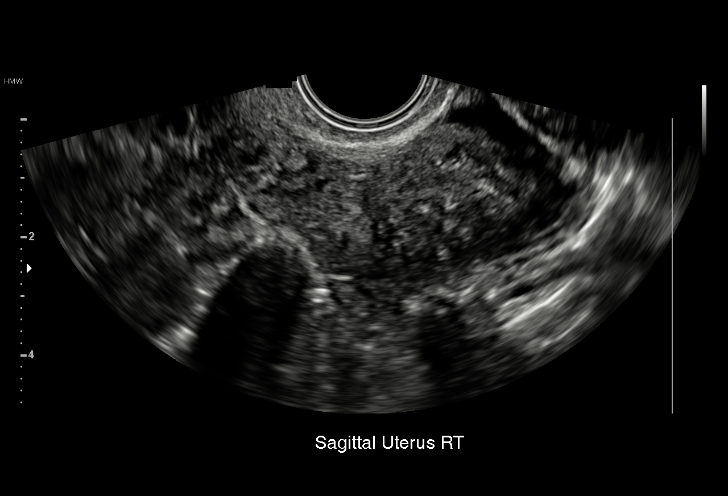
[im 65/71]
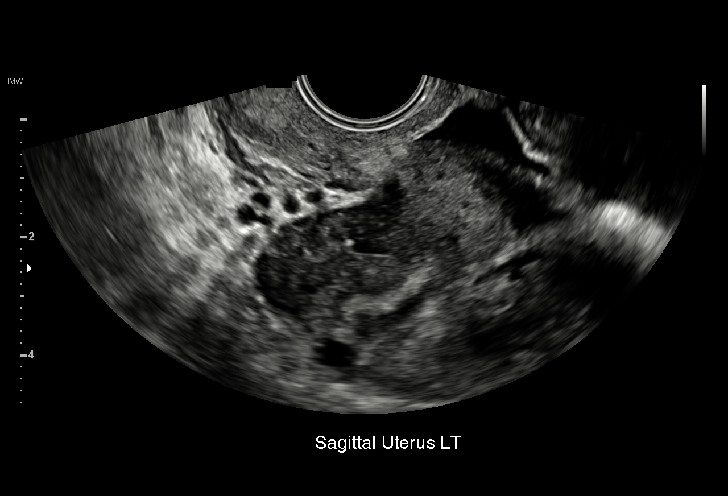
[im 71/71]
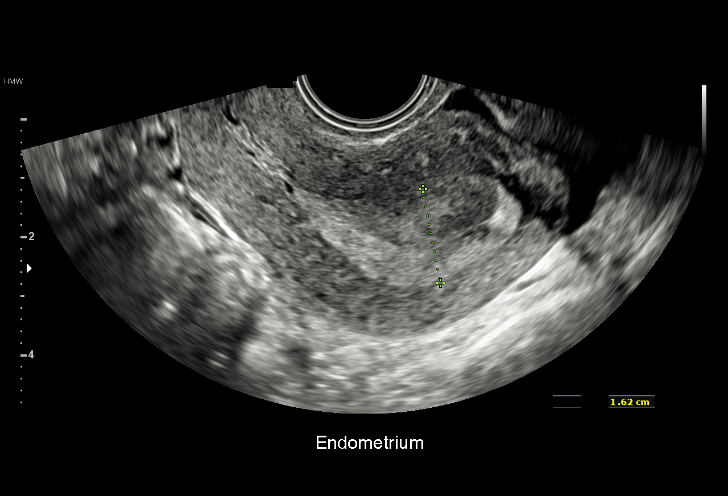

[15 of 25 positions shown; findings below may reference images not displayed]

FINDINGS: Uterus

Measurements: 6.9 x 4.0 x 4.5 cm. No fibroids or other mass
visualized.

Endometrium

Thickness: 16 mm in thickness.  No focal abnormality visualized.

Right ovary

Measurements: 3.3 x 1.6 x 3.1 cm. Normal appearance/no adnexal mass.

Left ovary

Measurements: 4.8 x 1.1 x 1.3 cm. Normal appearance/no adnexal mass.

Other findings

Trace free fluid in the pelvis.
IMPRESSION: Borderline thickened endometrium at 16 mm. If bleeding remains
unresponsive to hormonal or medical therapy, focal lesion work-up
with sonohysterogram should be considered. Endometrial biopsy should
also be considered in pre-menopausal patients at high risk for
endometrial carcinoma. (Ref: Radiological Reasoning: Algorithmic
Workup of Abnormal Vaginal Bleeding with Endovaginal Sonography and
Sonohysterography. AJR [id number]; [id number])

## 2023-06-26 ENCOUNTER — Other Ambulatory Visit (INDEPENDENT_AMBULATORY_CARE_PROVIDER_SITE_OTHER): Payer: Self-pay

## 2023-06-26 ENCOUNTER — Encounter: Payer: Self-pay | Admitting: Orthopaedic Surgery

## 2023-06-26 ENCOUNTER — Ambulatory Visit (INDEPENDENT_AMBULATORY_CARE_PROVIDER_SITE_OTHER): Payer: Self-pay | Admitting: Orthopaedic Surgery

## 2023-06-26 DIAGNOSIS — M79602 Pain in left arm: Secondary | ICD-10-CM

## 2023-06-26 DIAGNOSIS — M79601 Pain in right arm: Secondary | ICD-10-CM

## 2023-06-26 MED ORDER — PREDNISONE 10 MG (21) PO TBPK: ORAL_TABLET | ORAL | 3 refills | Status: AC

## 2023-06-26 NOTE — Progress Notes (Signed)
Office Visit Note   Patient: [name]           Date of Birth: 1975-12-05           MRN: [id number] Visit Date: 06/26/2023              Requested by: Sherren Mocha, MD [address] [place],  [place] [address] PCP: Sherren Mocha, MD   Assessment & Plan: Visit Diagnoses:  1. Bilateral arm pain     Plan: Impression is 47 year old female with right arm pain.  I doubt she has a structural problem.  My sense is that she has inflammation and overuse.  I would like to place her on a prednisone Dosepak to see if this will help her symptoms.  She will also modify activity as needed.  Follow-up if symptoms do not improve.  Interpreter present today.  Follow-Up Instructions: No follow-ups on file.   Orders:  Orders Placed This Encounter  Procedures   XR Cervical Spine 2 or 3 views   Meds ordered this encounter  Medications   predniSONE (STERAPRED UNI-PAK 21 TAB) 10 MG (21) TBPK tablet    Sig: Take as directed    Dispense:  21 tablet    Refill:  3      Procedures: No procedures performed   Clinical Data: No additional findings.   Subjective: Chief Complaint  Patient presents with   Right Shoulder - Pain    HPI Patient is a 47 year old Hispanic female here for 4 months of right shoulder pain that localizes to the lateral shoulder region that occasionally radiates down to the middle finger.  Has pain in the DIP of the middle finger.  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  Has taken ibuprofen and Aleve as needed.  Has rested her shoulder for couple months and has not noticed any significant improvement.  Denies any radicular symptoms.  Had a IM injection at PCPs office which helped temporarily. Review of Systems  Constitutional: Negative.   HENT: Negative.    Eyes: Negative.   Respiratory: Negative.    Cardiovascular: Negative.   Endocrine: Negative.   Musculoskeletal: Negative.   Neurological: Negative.   Hematological: Negative.    Psychiatric/Behavioral: Negative.    All other systems reviewed and are negative.    Objective: Vital Signs: There were no vitals taken for this visit.  Physical Exam Vitals and nursing note reviewed.  Constitutional:      Appearance: She is well-developed.  HENT:     Head: Atraumatic.     Nose: Nose normal.  Eyes:     Extraocular Movements: Extraocular movements intact.  Cardiovascular:     Pulses: Normal pulses.  Pulmonary:     Effort: Pulmonary effort is normal.  Abdominal:     Palpations: Abdomen is soft.  Musculoskeletal:     Cervical back: Neck supple.  Skin:    General: Skin is warm.     Capillary Refill: Capillary refill takes less than 2 seconds.  Neurological:     Mental Status: She is alert. Mental status is at baseline.  Psychiatric:        Behavior: Behavior normal.        Thought Content: Thought content normal.        Judgment: Judgment normal.     Ortho Exam Exam of the right shoulder shows normal range of motion.  She has pain with Speed test.  No tenderness in the bicipital groove.  Rotator cuff is normal to manual muscle  testing.  No impingement signs.  No cervical abnormalities. Specialty Comments:  No specialty comments available.  Imaging: XR Cervical Spine 2 or 3 views  Result Date: 06/26/2023 X-rays of the cervical spine show mild degenerative changes without any acute or structural abnormalities.    PMFS History: Patient Active Problem List   Diagnosis Date Noted   Painful bladder spasm 09/04/2016   Past Medical History:  Diagnosis Date   Gallstones    UTI (lower urinary tract infection)     History reviewed. No pertinent family history.  Past Surgical History:  Procedure Laterality Date   CHOLECYSTECTOMY     DILATION AND CURETTAGE OF UTERUS     TUBAL LIGATION     Social History   Occupational History   Not on file  Tobacco Use   Smoking status: Never   Smokeless tobacco: Never  Substance and Sexual Activity   Alcohol  use: No   Drug use: No   Sexual activity: Yes    Birth control/protection: Surgical    Comment: last intercurse 3 wks ago

## 2023-07-06 ENCOUNTER — Emergency Department (HOSPITAL_COMMUNITY)
Admission: EM | Admit: 2023-07-06 | Discharge: 2023-07-06 | Disposition: A | Discharge status: Home / Self Care | Payer: Self-pay | Attending: Emergency Medicine | Admitting: Emergency Medicine

## 2023-07-06 ENCOUNTER — Emergency Department (HOSPITAL_COMMUNITY): Payer: Self-pay

## 2023-07-06 ENCOUNTER — Encounter (HOSPITAL_COMMUNITY): Payer: Self-pay

## 2023-07-06 ENCOUNTER — Other Ambulatory Visit: Payer: Self-pay

## 2023-07-06 DIAGNOSIS — R519 Headache, unspecified: Secondary | ICD-10-CM

## 2023-07-06 DIAGNOSIS — R42 Dizziness and giddiness: Secondary | ICD-10-CM | POA: Insufficient documentation

## 2023-07-06 LAB — CBC WITH DIFFERENTIAL/PLATELET
Abs Immature Granulocytes: 0.02 10*3/uL (ref 0.00–0.07)
Basophils Absolute: 0 10*3/uL (ref 0.0–0.1)
Basophils Relative: 0 %
Eosinophils Absolute: 0.1 10*3/uL (ref 0.0–0.5)
Eosinophils Relative: 2 %
HCT: 35.4 % — ABNORMAL LOW (ref 36.0–46.0)
Hemoglobin: 11.9 g/dL — ABNORMAL LOW (ref 12.0–15.0)
Immature Granulocytes: 0 %
Lymphocytes Relative: 28 %
Lymphs Abs: 1.7 10*3/uL (ref 0.7–4.0)
MCH: 29.4 pg (ref 26.0–34.0)
MCHC: 33.6 g/dL (ref 30.0–36.0)
MCV: 87.4 fL (ref 80.0–100.0)
Monocytes Absolute: 0.5 10*3/uL (ref 0.1–1.0)
Monocytes Relative: 8 %
Neutro Abs: 3.8 10*3/uL (ref 1.7–7.7)
Neutrophils Relative %: 62 %
Platelets: 285 10*3/uL (ref 150–400)
RBC: 4.05 MIL/uL (ref 3.87–5.11)
RDW: 12.4 % (ref 11.5–15.5)
WBC: 6.1 10*3/uL (ref 4.0–10.5)
nRBC: 0 % (ref 0.0–0.2)

## 2023-07-06 LAB — BASIC METABOLIC PANEL
Anion gap: 7 (ref 5–15)
BUN: 12 mg/dL (ref 6–20)
CO2: 24 mmol/L (ref 22–32)
Calcium: 8.7 mg/dL — ABNORMAL LOW (ref 8.9–10.3)
Chloride: 102 mmol/L (ref 98–111)
Creatinine, Ser: 0.56 mg/dL (ref 0.44–1.00)
GFR, Estimated: >60 mL/min (ref >60)
Glucose, Bld: 102 mg/dL — ABNORMAL HIGH (ref 70–99)
Potassium: 3.5 mmol/L (ref 3.5–5.1)
Sodium: 133 mmol/L — ABNORMAL LOW (ref 135–145)

## 2023-07-06 LAB — MAGNESIUM: Magnesium: 2 mg/dL (ref 1.7–2.4)

## 2023-07-06 MED ORDER — ACETAMINOPHEN 500 MG PO TABS
1000.0000 mg | ORAL_TABLET | Freq: Once | ORAL | Status: AC
  Administered 2023-07-06: 1000 mg via ORAL
  Filled 2023-07-06: qty 2

## 2023-07-06 MED ORDER — DIPHENHYDRAMINE HCL 25 MG PO CAPS
25.0000 mg | ORAL_CAPSULE | Freq: Once | ORAL | Status: AC
  Administered 2023-07-06: 25 mg via ORAL
  Filled 2023-07-06: qty 1

## 2023-07-06 MED ORDER — PROCHLORPERAZINE EDISYLATE 10 MG/2ML IJ SOLN
10.0000 mg | Freq: Once | INTRAMUSCULAR | Status: AC
  Administered 2023-07-06: 10 mg via INTRAMUSCULAR
  Filled 2023-07-06: qty 2

## 2023-07-06 MED ORDER — KETOROLAC TROMETHAMINE 15 MG/ML IJ SOLN
15.0000 mg | Freq: Once | INTRAMUSCULAR | Status: AC
  Administered 2023-07-06: 15 mg via INTRAMUSCULAR
  Filled 2023-07-06: qty 1

## 2023-07-06 NOTE — Discharge Instructions (Signed)
Your workup was reassuring.  CT scan did not show anything concerning.  Your headache improved after medicines.  Follow-up with your primary care doctor.  For any concerning symptoms return to the emergency room.

## 2023-07-06 NOTE — ED Provider Triage Note (Signed)
Emergency Medicine Provider Triage Evaluation Note  [name] , a 47 y.o. female  was evaluated in triage.  Pt complains of right sided headache, numbness and tingling for the past 3 days.  Patient was being treated with prednisone for right shoulder pain.  Her symptoms began after stopping the prednisone pack.  She does have a history of migraines and headaches, but the numbness and tingling is different for her.  Denies vision changes, weakness in the arms or legs, slurred speech.  Review of Systems  Positive: As above Negative: As above  Physical Exam  BP 113/66 (BP Location: Left Arm)   Pulse 70   Temp 98 F (36.7 C) (Oral)   Resp 16   SpO2 100%  Gen:   Awake, no distress   Resp:  Normal effort  MSK:   Moves extremities without difficulty  Other:  No pronator drift, speech is clear, no facial droop, EOM intact, patient moving all extremities appropriately  Medical Decision Making  Medically screening exam initiated at 4:34 PM.  Appropriate orders placed.  Lauramae P Aguinaga was informed that the remainder of the evaluation will be completed by another provider, this initial triage assessment does not replace that evaluation, and the importance of remaining in the ED until their evaluation is complete.     Lenard Simmer, PA-C 07/06/23 1635

## 2023-07-06 NOTE — ED Triage Notes (Signed)
Pt arrived reporting numbness to right side of head and headache for 2 days. States she went to urgent care and was sent to Ed for eval. Also reports R arm pain, saw ortho a few days ago for it.

## 2023-07-06 NOTE — ED Provider Notes (Signed)
[facility] AT [facility] Provider Note   CSN: [id number] Arrival date & time: 07/06/23  1605     History  Chief Complaint  Patient presents with   Numbness   Headache    [name] is a 47 y.o. female.  47 year old female presents today for evaluation of headache.  Ongoing for the past couple days.  Endorses right-sided facial numbness.  She also states that she is recently been placed on prednisone Dosepak 10 mg which she has been taking for some right hand pain.  Denies any weakness in the right upper extremity, facial droop, speech difficulty.  She does have history of headaches but states that she never had numbness associated with it.  She has taken Excedrin which has given her some relief.  No vision change.  The history is provided by the patient. A language interpreter was used.       Home Medications Prior to Admission medications   Medication Sig Start Date End Date Taking? Authorizing Provider  EXCEDRIN MIGRAINE RELIEF [id number] MG tablet Take 2 tablets by mouth every 6 (six) hours as needed for headache or migraine.   Yes [provider]  amitriptyline (ELAVIL) 50 MG tablet Take 1 tablet (50 mg total) by mouth at bedtime. Patient not taking: Reported on 07/06/2023 09/05/16   Willodean Rosenthal, MD  ibuprofen (ADVIL,MOTRIN) 600 MG tablet Take 1 tablet (600 mg total) by mouth every 6 (six) hours as needed. Patient not taking: Reported on 07/06/2023 08/27/17   Hurshel Party, CNM  oseltamivir (TAMIFLU) 75 MG capsule Take 1 capsule (75 mg total) by mouth every 12 (twelve) hours. Patient not taking: Reported on 07/06/2023 09/02/17   Judeth Horn, NP  phenazopyridine (PYRIDIUM) 200 MG tablet Take 1 tablet (200 mg total) by mouth 3 (three) times daily as needed for pain (urethral spasm). Patient not taking: Reported on 07/06/2023 08/27/17   Leftwich-Kirby, Wilmer Floor, CNM  predniSONE (STERAPRED UNI-PAK 21 TAB) 10 MG (21)  TBPK tablet Take as directed Patient not taking: Reported on 07/06/2023 06/26/23   Tarry Kos, MD  sulfamethoxazole-trimethoprim (BACTRIM DS,SEPTRA DS) 800-160 MG tablet Take 1 tablet by mouth 2 (two) times daily. Patient not taking: Reported on 07/06/2023 09/02/17   Judeth Horn, NP  traMADol (ULTRAM) 50 MG tablet Take 1 tablet (50 mg total) by mouth every 6 (six) hours as needed. Patient not taking: Reported on 07/06/2023 08/27/17   Hurshel Party, CNM      Allergies    Patient has no known allergies.    Review of Systems   Review of Systems  Constitutional:  Negative for chills and fever.  Eyes:  Negative for visual disturbance.  Neurological:  Positive for numbness and headaches. Negative for facial asymmetry, weakness and light-headedness.  All other systems reviewed and are negative.   Physical Exam Updated Vital Signs BP 120/69   Pulse 63   Temp 98.2 F (36.8 C) (Oral)   Resp 14   Ht 5\' 1"  (1.549 m)   LMP 08/07/2017   SpO2 100%   BMI 20.60 kg/m  Physical Exam Vitals and nursing note reviewed.  Constitutional:      General: She is not in acute distress.    Appearance: Normal appearance. She is not ill-appearing.  HENT:     Head: Normocephalic and atraumatic.     Nose: Nose normal.  Eyes:     Extraocular Movements: Extraocular movements intact.     Conjunctiva/sclera: Conjunctivae normal.  Pupils: Pupils are equal, round, and reactive to light.  Cardiovascular:     Rate and Rhythm: Normal rate and regular rhythm.  Pulmonary:     Effort: Pulmonary effort is normal. No respiratory distress.  Musculoskeletal:        General: No deformity. Normal range of motion.     Cervical back: Normal range of motion.  Skin:    Findings: No rash.  Neurological:     General: No focal deficit present.     Mental Status: She is alert and oriented to person, place, and time.     Cranial Nerves: No cranial nerve deficit.     Sensory: No sensory deficit.     Motor:  No weakness.     ED Results / Procedures / Treatments   Labs (all labs ordered are listed, but only abnormal results are displayed) Labs Reviewed  CBC WITH DIFFERENTIAL/PLATELET - Abnormal; Notable for the following components:      Result Value   Hemoglobin 11.9 (*)    HCT 35.4 (*)    All other components within normal limits  BASIC METABOLIC PANEL - Abnormal; Notable for the following components:   Sodium 133 (*)    Glucose, Bld 102 (*)    Calcium 8.7 (*)    All other components within normal limits  MAGNESIUM    EKG None  Radiology CT Head Wo Contrast Result Date: 07/06/2023 CLINICAL DATA:  Headache, neuro deficit LKW 3 days ago. Right-sided head numbness and headache. EXAM: CT HEAD WITHOUT CONTRAST TECHNIQUE: Contiguous axial images were obtained from the base of the skull through the vertex without intravenous contrast. RADIATION DOSE REDUCTION: This exam was performed according to the departmental dose-optimization program which includes automated exposure control, adjustment of the mA and/or kV according to patient size and/or use of iterative reconstruction technique. COMPARISON:  None Available. FINDINGS: Brain: No acute intracranial hemorrhage. Gray-white differentiation is preserved. No hydrocephalus or extra-axial collection. No mass effect or midline shift. Vascular: No hyperdense vessel or unexpected calcification. Skull: No calvarial fracture or suspicious bone lesion. Skull base is unremarkable. Sinuses/Orbits: No acute finding. Other: None. IMPRESSION: No acute intracranial abnormality. Electronically Signed   By: Orvan Falconer M.D.   On: 07/06/2023 18:56    Procedures Procedures    Medications Ordered in ED Medications  acetaminophen (TYLENOL) tablet 1,000 mg (1,000 mg Oral Given 07/06/23 1818)  ketorolac (TORADOL) 15 MG/ML injection 15 mg (15 mg Intramuscular Given 07/06/23 2145)  prochlorperazine (COMPAZINE) injection 10 mg (10 mg Intramuscular Given  07/06/23 2146)  diphenhydrAMINE (BENADRYL) capsule 25 mg (25 mg Oral Given 07/06/23 2145)    ED Course/ Medical Decision Making/ A&P Clinical Course as of 07/06/23 2232  Fri Jul 06, 2023  2050 Magnesium [AA]    Clinical Course User Index [AA] Marita Kansas, PA-C                                 Medical Decision Making Amount and/or Complexity of Data Reviewed Labs:  Decision-making details documented in ED Course.  Risk Prescription drug management.   47 year old female presents today with headache and right-sided facial numbness.  Does have history of headaches.  But states never had numbness associated with it.  Neurological exam without focal deficits.  CT head without acute concern.  Labs without acute concern.  Electrolytes within normal.  Migraine cocktail given.  Headache resolved.  Discussed follow-up with PCP.  Return precaution discussed.  Patient voices understanding and is in agreement with plan.   Final Clinical Impression(s) / ED Diagnoses Final diagnoses:  Bad headache    Rx / DC Orders ED Discharge Orders     None         Marita Kansas, PA-C 07/06/23 2237    Lonell Grandchild, MD 07/07/23 1318

## 2023-12-21 ENCOUNTER — Emergency Department (HOSPITAL_BASED_OUTPATIENT_CLINIC_OR_DEPARTMENT_OTHER)
Admission: EM | Admit: 2023-12-21 | Discharge: 2023-12-21 | Disposition: A | Discharge status: Home / Self Care | Attending: Emergency Medicine | Admitting: Emergency Medicine

## 2023-12-21 ENCOUNTER — Encounter (HOSPITAL_BASED_OUTPATIENT_CLINIC_OR_DEPARTMENT_OTHER): Payer: Self-pay

## 2023-12-21 ENCOUNTER — Other Ambulatory Visit: Payer: Self-pay

## 2023-12-21 DIAGNOSIS — R519 Headache, unspecified: Secondary | ICD-10-CM | POA: Diagnosis not present

## 2023-12-21 DIAGNOSIS — M79602 Pain in left arm: Secondary | ICD-10-CM | POA: Diagnosis present

## 2023-12-21 DIAGNOSIS — Y92410 Unspecified street and highway as the place of occurrence of the external cause: Secondary | ICD-10-CM | POA: Diagnosis not present

## 2023-12-21 DIAGNOSIS — H9202 Otalgia, left ear: Secondary | ICD-10-CM | POA: Insufficient documentation

## 2023-12-21 MED ORDER — METHOCARBAMOL 500 MG PO TABS: 500.0000 mg | ORAL_TABLET | Freq: Two times a day (BID) | ORAL | 0 refills | Status: AC

## 2023-12-21 MED ORDER — IBUPROFEN 600 MG PO TABS: 600.0000 mg | ORAL_TABLET | Freq: Four times a day (QID) | ORAL | 0 refills | Status: AC | PRN
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
## 2024-01-01 ENCOUNTER — Other Ambulatory Visit: Payer: Self-pay

## 2024-01-01 ENCOUNTER — Ambulatory Visit
Admission: RE | Admit: 2024-01-01 | Discharge: 2024-01-01 | Disposition: A | Discharge status: Home / Self Care | Payer: Self-pay | Source: Ambulatory Visit

## 2024-01-01 ENCOUNTER — Ambulatory Visit: Payer: Self-pay

## 2024-01-01 DIAGNOSIS — M546 Pain in thoracic spine: Secondary | ICD-10-CM

## 2024-01-01 DIAGNOSIS — M25531 Pain in right wrist: Secondary | ICD-10-CM

## 2024-01-01 DIAGNOSIS — M25521 Pain in right elbow: Secondary | ICD-10-CM

## 2024-01-01 DIAGNOSIS — M25512 Pain in left shoulder: Secondary | ICD-10-CM

## 2024-01-01 DIAGNOSIS — M25532 Pain in left wrist: Secondary | ICD-10-CM

## 2024-01-01 DIAGNOSIS — M25561 Pain in right knee: Secondary | ICD-10-CM

## 2024-01-01 DIAGNOSIS — M542 Cervicalgia: Secondary | ICD-10-CM

## 2024-01-01 DIAGNOSIS — M545 Low back pain, unspecified: Secondary | ICD-10-CM

## 2024-05-26 ENCOUNTER — Encounter: Payer: Self-pay | Admitting: Radiology
